# Patient Record
Sex: Male | Born: 1990 | Race: White | Hispanic: No | Marital: Single | Smoking: Former smoker
Health system: Southern US, Community
[De-identification: ages and names within clinical notes are randomized; demographics above are authoritative.]

## PROBLEM LIST (undated history)

## (undated) DIAGNOSIS — M109 Gout, unspecified: Secondary | ICD-10-CM

## (undated) DIAGNOSIS — E785 Hyperlipidemia, unspecified: Secondary | ICD-10-CM

## (undated) HISTORY — DX: Hyperlipidemia, unspecified: E78.5

## (undated) HISTORY — DX: Gout, unspecified: M10.9

---
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
## 2016-06-11 DIAGNOSIS — Z720 Tobacco use: Secondary | ICD-10-CM | POA: Insufficient documentation

## 2019-01-14 DIAGNOSIS — H6993 Unspecified Eustachian tube disorder, bilateral: Secondary | ICD-10-CM | POA: Insufficient documentation

## 2019-03-29 ENCOUNTER — Other Ambulatory Visit: Payer: Self-pay | Admitting: *Deleted

## 2019-03-29 DIAGNOSIS — Z20822 Contact with and (suspected) exposure to covid-19: Secondary | ICD-10-CM

## 2019-03-31 LAB — NOVEL CORONAVIRUS, NAA: SARS-CoV-2, NAA: NOT DETECTED

## 2019-04-20 ENCOUNTER — Other Ambulatory Visit: Payer: Self-pay

## 2019-04-20 DIAGNOSIS — Z20822 Contact with and (suspected) exposure to covid-19: Secondary | ICD-10-CM

## 2019-04-22 LAB — NOVEL CORONAVIRUS, NAA: SARS-CoV-2, NAA: NOT DETECTED

## 2019-05-11 ENCOUNTER — Ambulatory Visit: Payer: HRSA Program | Attending: Internal Medicine

## 2019-05-11 DIAGNOSIS — Z20828 Contact with and (suspected) exposure to other viral communicable diseases: Secondary | ICD-10-CM | POA: Diagnosis present

## 2019-05-11 DIAGNOSIS — Z20822 Contact with and (suspected) exposure to covid-19: Secondary | ICD-10-CM

## 2019-05-12 LAB — NOVEL CORONAVIRUS, NAA: SARS-CoV-2, NAA: NOT DETECTED

## 2019-07-29 ENCOUNTER — Ambulatory Visit: Payer: Self-pay | Attending: Internal Medicine

## 2019-07-29 DIAGNOSIS — Z23 Encounter for immunization: Secondary | ICD-10-CM

## 2019-07-29 NOTE — Progress Notes (Signed)
   Covid-19 Vaccination Clinic  Name:  Devin Jenkins    MRN: [id number] DOB: 08/03/1990  07/29/2019  Devin Jenkins was observed post Covid-19 immunization for 15 minutes without incident. He was provided with Vaccine Information Sheet and instruction to access the V-Safe system.   Devin Jenkins was instructed to call 911 with any severe reactions post vaccine: Marland Kitchen Difficulty breathing  . Swelling of face and throat  . A fast heartbeat  . A bad rash all over body  . Dizziness and weakness   Immunizations Administered    Name Date Dose VIS Date Route   Pfizer COVID-19 Vaccine 07/29/2019 11:14 AM 0.3 mL 04/23/2019 Intramuscular   Manufacturer: [employer], [employer]   Lot: [id number]   NDC: [address]-2

## 2019-08-23 ENCOUNTER — Ambulatory Visit: Payer: Self-pay | Attending: Internal Medicine

## 2019-08-23 DIAGNOSIS — Z23 Encounter for immunization: Secondary | ICD-10-CM

## 2019-08-23 NOTE — Progress Notes (Signed)
   Covid-19 Vaccination Clinic  Name:  Devin Jenkins    MRN: [id number] DOB: 05/26/1990  08/23/2019  Devin Jenkins was observed post Covid-19 immunization for 15 minutes without incident. He was provided with Vaccine Information Sheet and instruction to access the V-Safe system.   Devin Jenkins was instructed to call 911 with any severe reactions post vaccine: Marland Kitchen Difficulty breathing  . Swelling of face and throat  . A fast heartbeat  . A bad rash all over body  . Dizziness and weakness   Immunizations Administered    Name Date Dose VIS Date Route   Pfizer COVID-19 Vaccine 08/23/2019 10:14 AM 0.3 mL 04/23/2019 Intramuscular   Manufacturer: [employer], [employer]   Lot: [id number]   NDC: [address]-3

## 2022-01-28 DIAGNOSIS — L538 Other specified erythematous conditions: Secondary | ICD-10-CM | POA: Diagnosis not present

## 2022-01-28 DIAGNOSIS — L821 Other seborrheic keratosis: Secondary | ICD-10-CM | POA: Diagnosis not present

## 2022-01-28 DIAGNOSIS — L820 Inflamed seborrheic keratosis: Secondary | ICD-10-CM | POA: Diagnosis not present

## 2022-01-28 DIAGNOSIS — D2372 Other benign neoplasm of skin of left lower limb, including hip: Secondary | ICD-10-CM | POA: Diagnosis not present

## 2022-09-01 DIAGNOSIS — M79671 Pain in right foot: Secondary | ICD-10-CM | POA: Diagnosis not present

## 2022-09-01 DIAGNOSIS — J019 Acute sinusitis, unspecified: Secondary | ICD-10-CM | POA: Diagnosis not present

## 2022-11-09 DIAGNOSIS — M5489 Other dorsalgia: Secondary | ICD-10-CM | POA: Diagnosis not present

## 2022-11-09 DIAGNOSIS — R109 Unspecified abdominal pain: Secondary | ICD-10-CM | POA: Diagnosis not present

## 2022-11-21 DIAGNOSIS — M1000 Idiopathic gout, unspecified site: Secondary | ICD-10-CM | POA: Diagnosis not present

## 2023-02-08 DIAGNOSIS — R251 Tremor, unspecified: Secondary | ICD-10-CM | POA: Diagnosis not present

## 2023-02-08 DIAGNOSIS — R000 Tachycardia, unspecified: Secondary | ICD-10-CM | POA: Diagnosis not present

## 2023-02-08 DIAGNOSIS — Z13220 Encounter for screening for lipoid disorders: Secondary | ICD-10-CM | POA: Diagnosis not present

## 2023-02-08 DIAGNOSIS — M546 Pain in thoracic spine: Secondary | ICD-10-CM | POA: Diagnosis not present

## 2023-02-08 DIAGNOSIS — R079 Chest pain, unspecified: Secondary | ICD-10-CM | POA: Diagnosis not present

## 2023-02-16 DIAGNOSIS — I498 Other specified cardiac arrhythmias: Secondary | ICD-10-CM | POA: Diagnosis not present

## 2023-03-12 ENCOUNTER — Ambulatory Visit: Payer: BC Managed Care – PPO | Admitting: Internal Medicine

## 2023-03-12 ENCOUNTER — Encounter: Payer: Self-pay | Admitting: Internal Medicine

## 2023-03-12 VITALS — BP 122/80 | HR 82 | Temp 98.0°F | Ht 70.0 in | Wt 202.8 lb

## 2023-03-12 DIAGNOSIS — L659 Nonscarring hair loss, unspecified: Secondary | ICD-10-CM | POA: Diagnosis not present

## 2023-03-12 DIAGNOSIS — J3089 Other allergic rhinitis: Secondary | ICD-10-CM | POA: Diagnosis not present

## 2023-03-12 DIAGNOSIS — E781 Pure hyperglyceridemia: Secondary | ICD-10-CM | POA: Insufficient documentation

## 2023-03-12 DIAGNOSIS — E785 Hyperlipidemia, unspecified: Secondary | ICD-10-CM | POA: Diagnosis not present

## 2023-03-12 DIAGNOSIS — Z8249 Family history of ischemic heart disease and other diseases of the circulatory system: Secondary | ICD-10-CM | POA: Diagnosis not present

## 2023-03-12 DIAGNOSIS — M109 Gout, unspecified: Secondary | ICD-10-CM | POA: Insufficient documentation

## 2023-03-12 DIAGNOSIS — J324 Chronic pansinusitis: Secondary | ICD-10-CM

## 2023-03-12 LAB — LIPID PANEL
Cholesterol: 149 mg/dL (ref 0–200)
HDL: 35.4 mg/dL — ABNORMAL LOW (ref >39.00)
LDL Cholesterol: 92 mg/dL (ref 0–99)
NonHDL: 113.87
Total CHOL/HDL Ratio: 4
Triglycerides: 108 mg/dL (ref 0.0–149.0)
VLDL: 21.6 mg/dL (ref 0.0–40.0)

## 2023-03-12 MED ORDER — SIMPLY SALINE 0.9 % NA AERS: 2.0000 | INHALATION_SPRAY | NASAL | 11 refills | Status: AC

## 2023-03-12 MED ORDER — TURMERIC 5-1000 MG PO CAPS: 1000.0000 mg | ORAL_CAPSULE | Freq: Two times a day (BID) | ORAL | 3 refills | Status: AC

## 2023-03-12 MED ORDER — MAGNESIUM 500 MG PO CAPS: 500.0000 mg | ORAL_CAPSULE | Freq: Two times a day (BID) | ORAL | 3 refills | Status: AC

## 2023-03-12 MED ORDER — FINASTERIDE 1 MG PO TABS: 1.0000 mg | ORAL_TABLET | Freq: Every day | ORAL | 3 refills | Status: DC

## 2023-03-12 MED ORDER — PSEUDOEPHEDRINE HCL ER 120 MG PO TB12: 120.0000 mg | ORAL_TABLET | Freq: Two times a day (BID) | ORAL | 0 refills | Status: DC

## 2023-03-12 MED ORDER — OMEGA-3-ACID ETHYL ESTERS 1 G PO CAPS: 2.0000 g | ORAL_CAPSULE | Freq: Two times a day (BID) | ORAL | 3 refills | Status: DC

## 2023-03-12 MED ORDER — VITAMIN C 1000 MG PO TABS: 1000.0000 mg | ORAL_TABLET | Freq: Every day | ORAL | 3 refills | Status: AC

## 2023-03-12 MED ORDER — FLUTICASONE PROPIONATE 50 MCG/ACT NA SUSP: 2.0000 | Freq: Every day | NASAL | 50 refills | Status: DC

## 2023-03-12 MED ORDER — AMOXICILLIN-POT CLAVULANATE 875-125 MG PO TABS: 1.0000 | ORAL_TABLET | Freq: Two times a day (BID) | ORAL | 0 refills | Status: DC

## 2023-03-12 MED ORDER — LORATADINE 10 MG PO TABS: 10.0000 mg | ORAL_TABLET | Freq: Every day | ORAL | 11 refills | Status: AC

## 2023-03-12 MED ORDER — ROSUVASTATIN CALCIUM 40 MG PO TABS: 40.0000 mg | ORAL_TABLET | Freq: Every day | ORAL | 3 refills | Status: DC

## 2023-03-12 NOTE — Progress Notes (Signed)
[facility]: [phone]   -- Medical Office Visit --  Patient:  Devin Jenkins      Age: 32 y.o.       Sex:  male  Date:   03/12/2023 Today's Healthcare Provider: Lula Olszewski, MD  ============================================================================================= Assessment Plan    Assessment & Plan Hyperlipidemia, acquired Given his family history of early cardiovascular disease and personal history of high cholesterol, with a recent lipid panel showing total cholesterol at 236, triglycerides at 299, HDL at 37, LDL at 153, and non-HDL at 199, while on atorvastatin 10mg , we will discontinue atorvastatin and start rosuvastatin 40mg  daily. We will also add prescription-grade omega-3 fatty acids and order a lipid panel to assess the response to the new medication. He is advised to maintain a heart-healthy diet, including daily avocado.  Hair loss He is currently using over-the-counter treatments for hair loss and has inquired about prescription options. We will prescribe finasteride (Propecia) for hair loss.  Non-seasonal allergic rhinitis, unspecified trigger  Podagra He reports occasional flare-ups, about once a year, often triggered by dietary factors such as seafood. He prefers to manage the condition through diet and lifestyle modifications due to career implications of medication use. Does take tart cherry.  He is advised to continue dietary modifications to manage gout symptoms, with no medication prescribed at this time.   Family history of heart attack  Hypertriglyceridemia  Dyslipidemia  Chronic pansinusitis He reports chronic sinus congestion and yellow mucus, with no recent antibiotic treatment. We will prescribe a "sinus package" including Augmentin, Sudafed, Claritin, and a saline rinse. He is advised to use Flonase as needed, but not more than four days in a row, and to use a saline rinse nightly and Flonase as needed for allergy  symptoms or sinus congestion.   Diagnoses and all orders for this visit: Hyperlipidemia, acquired -     Turmeric 09-998 MG CAPS; Take 1,000 mg by mouth in the morning and at bedtime. -     rosuvastatin (CRESTOR) 40 MG tablet; Take 1 tablet (40 mg total) by mouth daily. Replaces atorvastatin -     omega-3 acid ethyl esters (LOVAZA) 1 g capsule; Take 2 capsules (2 g total) by mouth 2 (two) times daily. -     Lipid panel Hair loss -     finasteride (PROPECIA) 1 MG tablet; Take 1 tablet (1 mg total) by mouth daily. -     Turmeric 09-998 MG CAPS; Take 1,000 mg by mouth in the morning and at bedtime. -     Ascorbic Acid (VITAMIN C) 1000 MG tablet; Take 1 tablet (1,000 mg total) by mouth daily. Non-seasonal allergic rhinitis, unspecified trigger -     fluticasone (FLONASE) 50 MCG/ACT nasal spray; Place 2 sprays into both nostrils daily. -     Magnesium 500 MG CAPS; Take 1 capsule (500 mg total) by mouth in the morning and at bedtime. -     Turmeric 09-998 MG CAPS; Take 1,000 mg by mouth in the morning and at bedtime. Podagra -     Turmeric 09-998 MG CAPS; Take 1,000 mg by mouth in the morning and at bedtime. Family history of heart attack -     Turmeric 09-998 MG CAPS; Take 1,000 mg by mouth in the morning and at bedtime. -     rosuvastatin (CRESTOR) 40 MG tablet; Take 1 tablet (40 mg total) by mouth daily. Replaces atorvastatin -     omega-3 acid ethyl esters (LOVAZA) 1 g capsule;  Take 2 capsules (2 g total) by mouth 2 (two) times daily. -     Lipid panel Hypertriglyceridemia -     Turmeric 09-998 MG CAPS; Take 1,000 mg by mouth in the morning and at bedtime. -     rosuvastatin (CRESTOR) 40 MG tablet; Take 1 tablet (40 mg total) by mouth daily. Replaces atorvastatin -     omega-3 acid ethyl esters (LOVAZA) 1 g capsule; Take 2 capsules (2 g total) by mouth 2 (two) times daily. -     Lipid panel Dyslipidemia -     Turmeric 09-998 MG CAPS; Take 1,000 mg by mouth in the morning and at bedtime. -      rosuvastatin (CRESTOR) 40 MG tablet; Take 1 tablet (40 mg total) by mouth daily. Replaces atorvastatin -     omega-3 acid ethyl esters (LOVAZA) 1 g capsule; Take 2 capsules (2 g total) by mouth 2 (two) times daily. -     Lipid panel Chronic pansinusitis -     Saline (SIMPLY SALINE) 0.9 % AERS; Place 2 each into the nose as directed. Use nightly for sinus hygiene long-term.  Can also be used as many times daily as desired to assist with clearing congested sinuses. -     loratadine (CLARITIN) 10 MG tablet; Take 1 tablet (10 mg total) by mouth daily. -     pseudoephedrine (SUDAFED 12 HOUR) 120 MG 12 hr tablet; Take 1 tablet (120 mg total) by mouth 2 (two) times daily. -     amoxicillin-clavulanate (AUGMENTIN) 875-125 MG tablet; Take 1 tablet by mouth 2 (two) times daily.  Recommended follow-up: Annual check-ups are sufficient at this time. We will follow up after the lipid panel results are available.  Future Appointments  Date Time Provider Department Center  03/12/2024  8:40 AM Lula Olszewski, MD [facility]  Patient Care Team: Lula Olszewski, MD as PCP - General (Internal Medicine)    Subjective   32 y.o. male who has Podagra; Non-seasonal allergic rhinitis; Hair loss; Dyslipidemia; Family history of heart attack; and Hypertriglyceridemia on their problem list.. Main reasons for visit/main concerns/chief complaint: New Patient (Initial Visit) (/) and Hyperlipidemia   -------------------------------------------------------------------------------------------------- AI-Extracted: Discussed the use of AI scribe software for clinical note transcription with the patient, who gave verbal consent to proceed.  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 He has been taking atorvastatin 10mg , prescribed by a previous healthcare provider, but expresses uncertainty about the necessity of this  medication. The patient's most recent lipid panel, conducted approximately a month ago, revealed a total cholesterol of 236, triglycerides of 299, HDL of 37, LDL of 153, and non-HDL of 199. The patient's brother experienced a heart attack at the age of 67, which raises concerns about the patient's own cardiovascular risk.  The patient also reports chronic sinus congestion and yellow mucus production, which he suspects may be due to an ongoing infection. He has been using Flonase intermittently to manage these symptoms.  In addition, the patient has been taking finasteride for hair loss and is interested in obtaining a prescription for this medication. He also reports occasional flare-ups of gout, particularly after consuming certain types of fish. 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  The patient's lifestyle includes efforts to maintain a healthy diet and manage his  weight. He expresses a willingness to make dietary changes to improve his cholesterol levels and overall heart health. He also takes various supplements, including magnesium, turmeric, and vitamin C.       Note that patient  has a past medical history of Hyperlipidemia.  Problem list overviews that were updated at today's visit: Problem  Podagra  Non-Seasonal Allergic Rhinitis  Hair Loss  Dyslipidemia   Medications: not yet discussed No results found for: "HDL", "CHOLHDL" No results found for: "LDLCALC", "LDLDIRECT" No results found for: "TRIG" No results found for: "CHOL" The ASCVD Risk score (Arnett DK, et al., 2019) failed to calculate for the following reasons:   The 2019 ASCVD risk score is only valid for ages 92 to 38 No results found for: "ALT", "AST", "GGT", "ALKPHOS", "TSH", "HGBA1C" Body mass index is 29.1 kg/m.  Lipoprotein(a), Apolipoprotein B (ApoB), and High-sensitivity C-reactive  protein (hs-CRP) No results found for: "HSCRP", "LIPOA" he holds off on for now. Improving Your Cholesterol: Diet: Focus on a Mediterranean-style diet, limit saturated fats and sugars, and increase omega-3 fatty acids (fish, flaxseeds,nuts,extra virgin olive oil, avocados). Exercise: Engage in regular physical activity (aerobic exercises are particularly beneficial for HDL). Weight Management: Maintain a healthy weight. Smoking Cessation: Quitting smoking improves cholesterol levels.    Family History of Heart Attack  Hypertriglyceridemia    Med reconciliation: Current Outpatient Medications on File Prior to Visit  Medication Sig   atorvastatin (LIPITOR) 10 MG tablet Take by mouth daily.   UNABLE TO FIND Take 3,000 mg by mouth daily. Tart Cherry   No current facility-administered medications on file prior to visit.   Medications Discontinued During This Encounter  Medication Reason   fluticasone (FLONASE) 50 MCG/ACT nasal spray Reorder   Ascorbic Acid (VITAMIN C PO) Reorder   Magnesium 500 MG CAPS Reorder   Turmeric 09-998 MG CAPS Reorder   finasteride (PROPECIA) 1 MG tablet Reorder     Objective   Physical Exam  BP 122/80 (BP Location: Left Arm, Patient Position: Sitting)   Pulse 82   Temp 98 F (36.7 C) (Temporal)   Ht 5\' 10"  (1.778 m)   Wt 202 lb 12.8 oz (92 kg)   SpO2 97%   BMI 29.10 kg/m  Wt Readings from Last 10 Encounters:  03/12/23 202 lb 12.8 oz (92 kg)   Vital signs reviewed.  Nursing notes reviewed. Weight trend reviewed. Abnormalities and Problem-Specific physical exam findings:  minimal hair loss, mild truncal adiposity   General Appearance:  No acute distress appreciable.   Well-groomed, healthy-appearing male.  Well proportioned with no abnormal fat distribution.  Good muscle tone. Pulmonary:  Normal work of breathing at rest, no respiratory distress apparent. SpO2: 97 %  Musculoskeletal: All extremities are intact.  Neurological:  Awake, alert,  oriented, and engaged.  No obvious focal neurological deficits or cognitive impairments.  Sensorium seems unclouded.   Speech is clear and coherent with logical content. Psychiatric:  Appropriate mood, pleasant and cooperative demeanor, thoughtful and engaged during the exam  Results   LABS Lipid Panel: Total Cholesterol: 236 mg/dL, Triglycerides: 086 mg/dL, HDL: 37 mg/dL, LDL: 578 mg/dL, Non-HDL: 469 mg/dL (62/95/2841)  DIAGNOSTIC EKG: Normal (02/08/2023)        No results found for any visits on 03/12/23.  No visits with results within 1 Year(s) from this visit.  Latest known visit with results is:  Lab on 05/11/2019  Component Date Value   SARS-CoV-2, NAA 05/11/2019 Not Detected    No image results found.  No results found.  No results found.     Additional Info: This encounter employed real-time, collaborative documentation. The patient actively reviewed and updated their medical record on a shared screen, ensuring transparency and facilitating joint problem-solving for the problem list, overview, and plan. This approach promotes accurate, informed care. The treatment plan was discussed and reviewed in detail, including medication safety, potential side effects, and all patient questions. We confirmed understanding and comfort with the plan. Follow-up instructions were established, including contacting the office for any concerns, returning if symptoms worsen, persist, or new symptoms develop, and precautions for potential emergency department visits.

## 2023-03-12 NOTE — Assessment & Plan Note (Signed)
He reports occasional flare-ups, about once a year, often triggered by dietary factors such as seafood. He prefers to manage the condition through diet and lifestyle modifications due to career implications of medication use. Does take tart cherry.  He is advised to continue dietary modifications to manage gout symptoms, with no medication prescribed at this time.

## 2023-03-12 NOTE — Progress Notes (Signed)
Pretty good. HDL still low and family history puts you at risk but you've seen good improvement on the low dose medication(s) you were on.  It would not be unreasonable to just take half tablet daily of what I sent in.   The diet is really important too even if medication(s) fix the cholesterol.

## 2023-03-12 NOTE — Patient Instructions (Addendum)
Here is a comprehensive diet plan in patient handout format for a 32 year old male with a family history of early MI and severe dyslipidemia:  ## Comprehensive Diet Plan for Heart Health  ### Overview Managing your cholesterol and heart disease risk factors through diet is crucial. This plan focuses on foods that can help lower cholesterol, reduce inflammation, and improve overall heart health.  ### Key Dietary Components - **Omega-3 Rich Fish:** Salmon, mackerel, sardines, and herring are excellent sources of heart-healthy omega-3 fatty acids. These fish do not appear to trigger gout flares. - **Nuts & Seeds:** Walnuts, almonds, pecans, chia seeds, and flaxseeds are rich in healthy fats, fiber, and antioxidants. - **Extra Virgin Olive Oil (EVOO):** Use EVOO as your primary cooking oil. It's high in monounsaturated fats and antioxidants. - **Vegetables:** Aim for 5-9 servings of a variety of vegetables daily. Focus on leafy greens, cruciferous veggies, and colorful produce. - **Whole Grains:** Choose whole wheat, brown rice, quinoa, oats, and other minimally processed grains. - **Legumes:** Beans, lentils, and peas are excellent sources of fiber, protein, and complex carbs.  ### Sample Meal Plan **Breakfast:** - Oatmeal with berries, walnuts, and cinnamon - Avocado toast on whole grain bread  **Lunch:** - Salmon salad on a bed of mixed greens - Lentil and vegetable soup with a side of whole grain crackers  **Dinner:** - Grilled mackerel with roasted Brussels sprouts and sweet potatoes - Quinoa stir-fry with tofu, broccoli, and bell peppers  **Snacks:**  - Handful of raw almonds or pecans - Hummus with carrot and cucumber sticks  ### Tips for Success - Drink plenty of water throughout the day. - Limit processed foods, added sugars, and unhealthy fats. - Experiment with new recipes and flavor combinations to keep meals interesting. - Stay physically active for at least 30 minutes  most days. - Work closely with your healthcare team to monitor your progress.  Let me know if you have any other questions!  Chia seeds (5.8g omega-3 per 1 oz) Flaxseeds (6.3g omega-3 per 1 tbsp) Walnuts (2.5g omega-3 per 1 oz) Canola oil (1.3g omega-3 per 1 tbsp) Soybeans (0.9g omega-3 per 1/2 cup)  Chia seeds are an excellent source of plant-based omega-3s. They can be easily incorporated into smoothies, oatmeal, baked goods, and more. Let me know if you have any other questions!

## 2023-03-12 NOTE — Assessment & Plan Note (Signed)
Given his family history of early cardiovascular disease and personal history of high cholesterol, with a recent lipid panel showing total cholesterol at 236, triglycerides at 299, HDL at 37, LDL at 153, and non-HDL at 199, while on atorvastatin 10mg , we will discontinue atorvastatin and start rosuvastatin 40mg  daily. We will also add prescription-grade omega-3 fatty acids and order a lipid panel to assess the response to the new medication. He is advised to maintain a heart-healthy diet, including daily avocado.

## 2023-03-12 NOTE — Assessment & Plan Note (Signed)
He is currently using over-the-counter treatments for hair loss and has inquired about prescription options. We will prescribe finasteride (Propecia) for hair loss.

## 2023-04-23 DIAGNOSIS — D485 Neoplasm of uncertain behavior of skin: Secondary | ICD-10-CM | POA: Diagnosis not present

## 2023-04-23 DIAGNOSIS — D2372 Other benign neoplasm of skin of left lower limb, including hip: Secondary | ICD-10-CM | POA: Diagnosis not present

## 2023-04-23 DIAGNOSIS — L821 Other seborrheic keratosis: Secondary | ICD-10-CM | POA: Diagnosis not present

## 2023-07-09 DIAGNOSIS — J019 Acute sinusitis, unspecified: Secondary | ICD-10-CM | POA: Diagnosis not present

## 2023-07-10 ENCOUNTER — Telehealth: Payer: BC Managed Care – PPO | Admitting: Physician Assistant

## 2023-07-10 ENCOUNTER — Encounter (INDEPENDENT_AMBULATORY_CARE_PROVIDER_SITE_OTHER): Payer: Self-pay | Admitting: Internal Medicine

## 2023-07-10 DIAGNOSIS — J069 Acute upper respiratory infection, unspecified: Secondary | ICD-10-CM | POA: Diagnosis not present

## 2023-07-10 MED ORDER — FLUTICASONE PROPIONATE 50 MCG/ACT NA SUSP: 2.0000 | Freq: Every day | NASAL | 0 refills | Status: AC

## 2023-07-10 NOTE — Telephone Encounter (Signed)
 MyChart secure digital messaging clinical encounter  Chief Complaint:  "Cold symptoms including sore throat, congestion, and yellow mucus since Tuesday morning. No fever. Requested medication after e-visit recommendations of Flonase and Aleve-D were reportedly ineffective."  Relevant History: - Patient had an e-visit earlier today (07/10/2023) for URI symptoms - Known history of non-seasonal allergic rhinitis, currently using Flonase regularly - Previously tried Aleve-D as recommended in e-visit without satisfactory relief - No fever reported - Symptoms present for approximately 2 days - Patient specifically requesting stronger medication (steroids) based on girlfriend's experience with similar symptoms - PMH significant for dyslipidemia, podagra, hair loss, hypertriglyceridemia, family history of premature heart attack  Assessment: Acute upper respiratory infection (J06.9), likely viral in etiology, based on: - Short duration of symptoms (2 days) - Presence of yellow mucus, sore throat, and congestion without fever - Temporal relationship to reported similar illness in close contact - Underlying non-seasonal allergic rhinitis (J30.9) may be contributing to symptom burden  Differential considerations include: - Acute rhinosinusitis - Allergic rhinitis exacerbation - Acute pharyngitis  Current guidelines (American Academy of Family Physicians, 2023) recommend conservative symptomatic management for uncomplicated URI in the first 7-10 days, with consideration of additional interventions if symptoms persist or worsen after this period.  Plan: 1. Recommended continued use of regular Flonase for underlying allergic rhinitis 2. Advised saline nasal irrigation 2-3 times daily for symptom relief 3. Suggested addition of oral antihistamine if not already taking 4. Provided supportive care recommendations including salt water gargles, adequate hydration, and rest 5. No steroids or antibiotics  indicated at this time per current guidelines (https://www.LimitLaws.hu) 6. Patient educated on expected course of viral URI and appropriate follow-up timeframes 7. Provided specific parameters for when to reach out for reassessment (persistent symptoms >7-10 days, worsening after 5 days, development of fever >101F) 8. Detailed emergency warning signs requiring immediate care  Patient Education: - Explained typical course of viral upper respiratory infections - Discussed rationale for conservative management in early course of illness - Educated on differences between individual cases and why treatment approaches may vary - Provided home care strategies and symptom management techniques - Reviewed warning signs requiring further evaluation  Follow-up: - Patient advised to follow up via MyChart or schedule an appointment if symptoms persist beyond 7-10 days or worsen after 5 days - No routine follow-up specifically scheduled at this time  Medical Decision Making: Low complexity medical decision making due to: - Self-limited mild illness with established treatment protocol - Review and consideration of recent e-visit findings - Consideration of patient's request for specific medication (steroids) - Medical necessity to evaluate appropriateness of requested treatment - Analysis of current clinical guidelines regarding URI management - Risks vs. benefits assessment for corticosteroid use in early viral illness  Current Medical Guidelines Reference: According to the American Academy of Family Physicians (AAFP) and the Centers for Disease Control and Prevention (CDC), symptomatic treatment is recommended for uncomplicated viral URIs in the first 7-10 days of illness. Corticosteroids are not routinely recommended for uncomplicated viral URI early in the course, as benefits are limited and potential risks include immune suppression.  Please see the MyChart message  reply(ies) for my assessment and plan.   This patient gave consent for this Medical Advice Message and is aware that it may result in a bill to [employer], as well as the possibility of receiving a bill for a co-payment or deductible. They are an established patient, but are not seeking medical advice exclusively about a problem treated during  an in person or video visit in the last seven days. I did not recommend an in person or video visit within seven days of my reply.    I spent a total of 15 minutes cumulative time within 7 days through [employer]. Lula Olszewski, MD  ------------------------- Problem List Updates: Add: Acute upper respiratory infection (J06.9) - onset 07/08/2023

## 2023-07-10 NOTE — Progress Notes (Signed)

## 2023-07-10 NOTE — Progress Notes (Signed)
 I have spent 5 minutes in review of e-visit questionnaire, review and updating patient chart, medical decision making and response to patient.   Piedad Climes, PA-C

## 2023-07-29 ENCOUNTER — Ambulatory Visit (INDEPENDENT_AMBULATORY_CARE_PROVIDER_SITE_OTHER): Admitting: Sports Medicine

## 2023-07-29 ENCOUNTER — Encounter: Payer: Self-pay | Admitting: Sports Medicine

## 2023-07-29 VITALS — BP 124/82 | HR 80 | Temp 98.4°F | Resp 20 | Ht 70.0 in | Wt 207.1 lb

## 2023-07-29 DIAGNOSIS — Z0289 Encounter for other administrative examinations: Secondary | ICD-10-CM

## 2023-07-29 HISTORY — DX: Encounter for other administrative examinations: Z02.89

## 2023-07-29 NOTE — Progress Notes (Signed)
    Procedures performed today:    None.  Independent interpretation of notes and tests performed by another provider:   None.  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 No EKG needed until 33 years old.    ____________________________________________ Ihor Austin. Benjamin Stain, M.D., ABFM., CAQSM., AME. Primary Care and Sports Medicine [facility]  Adjunct Professor of Family Medicine  [employer]  Restaurant manager, fast food

## 2023-07-29 NOTE — Assessment & Plan Note (Signed)
 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 No EKG needed until 33 years old.

## 2023-12-27 DIAGNOSIS — M109 Gout, unspecified: Secondary | ICD-10-CM | POA: Diagnosis not present

## 2024-01-13 ENCOUNTER — Encounter: Payer: Self-pay | Admitting: Sports Medicine

## 2024-01-15 ENCOUNTER — Telehealth: Payer: Self-pay

## 2024-01-15 NOTE — Telephone Encounter (Signed)
 Copied from CRM #[id number]. Topic: Referral - Request for Referral >> Jan 15, 2024  2:04 PM [place] A wrote: Did the patient discuss referral with their provider in the last year? Yes (If No - schedule appointment) (If Yes - send message)  Appointment offered? Yes  Type of order/referral and detailed reason for visit: Rheumatology  Preference of office, provider, location: [facility] on [facility]  If referral order, have you been seen by this specialty before? No (If Yes, this issue or another issue? When? Where?  Can we respond through MyChart? Yes  Spoke with pt advise pt per provider pt needs appt with labs before we can even send a referral to [facility] due they require to do that for referrals. Pt states he will make appt.

## 2024-01-22 ENCOUNTER — Ambulatory Visit: Admitting: Internal Medicine

## 2024-02-02 ENCOUNTER — Encounter: Payer: Self-pay | Admitting: Internal Medicine

## 2024-02-02 ENCOUNTER — Ambulatory Visit: Admitting: Internal Medicine

## 2024-02-02 VITALS — BP 130/78 | HR 65 | Temp 97.7°F | Wt 212.0 lb

## 2024-02-02 DIAGNOSIS — M109 Gout, unspecified: Secondary | ICD-10-CM | POA: Diagnosis not present

## 2024-02-02 MED ORDER — ALLOPURINOL 100 MG PO TABS: 100.0000 mg | ORAL_TABLET | Freq: Every day | ORAL | 6 refills | Status: DC

## 2024-02-02 MED ORDER — COLCHICINE 0.6 MG PO CAPS: ORAL_CAPSULE | ORAL | 0 refills | Status: AC

## 2024-02-02 MED ORDER — PREDNISONE 20 MG PO TABS: ORAL_TABLET | ORAL | 0 refills | Status: DC

## 2024-02-02 NOTE — Assessment & Plan Note (Signed)
 Gout, right ankle and foot   Chronic gout with worsening flare-ups affects his right ankle and foot, lasting 8-9 days. Classic symptoms are present without crystal analysis, occurring approximately twice a year. Dietary changes include avoiding red meat, shellfish, and adjusting alcohol intake. 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 Start allopurinol  100 mg daily, titrate based on uric acid levels. Prescribe colchicine  for acute flare management, taken twice daily during flares. Prescribe prednisone  for severe flares, with caution regarding potential side effects like weight gain and Cushing's syndrome. Order a baseline uric acid blood test. Refer to podiatry for joint aspiration during a flare to confirm diagnosis. Discuss lifestyle modifications, including avoiding trigger foods and alcohol. Advise on the importance of hydration and avoiding dehydration. Discuss potential need for rheumatology referral for advanced treatment if necessary.  He has adjusted alcohol intake to manage gout, primarily consuming vodka and avoiding beer. Acknowledges difficulty in completely abstaining from beer but is making efforts to reduce consumption. Continue to limit alcohol intake, particularly beer, to manage gout symptoms. Discuss with AME regarding medication use and potential exceptions for gout management.

## 2024-02-02 NOTE — Progress Notes (Signed)
 ==============================  [facility]: [id number]   -- Medical Office Visit --  Patient: Devin Jenkins      Age: 33 y.o.       Sex:  male  Date:   02/02/2024 Today's Healthcare Provider: Bernardino KANDICE Cone, MD  ==============================   Chief Complaint: Foot Swelling (Pt feels like he may be experiencing gout symptoms and would like to discuss next steps. Pt declined flu vaccine today.)  Discussed the use of AI scribe software for clinical note transcription with the patient, who gave verbal consent to proceed.  History of Present Illness 33 year old male who presents with worsening gout flare-ups.  He experiences worsening gout flare-ups, with recent episodes lasting 8 to 9 days compared to previous episodes of 3 to 4 days. The flare-ups cause significant swelling and pain in his big toe, impairing his ability to walk. He has not had any crystals analyzed from his joints nor has he undergone blood work to confirm the diagnosis of gout.  He has made dietary changes, avoiding red meat and shellfish, although he notes a recent flare-up occurred after consuming lobster. He has gained approximately 10 pounds over the past year, attributing some of this to increased muscle mass from lifting weights. He maintains a waist size of 33 inches.  He avoids beer and primarily consumes vodka to minimize gout triggers. He stays hydrated, especially when using the sauna, as he notices dehydration can trigger flare-ups. He drinks alkaline water and consumes cherries, believing they may help manage his symptoms.  He has not been formally diagnosed with gout but suspects it due to the location and nature of his symptoms. He has not used colchicine  or allopurinol  previously. He has taken prednisone  in the past for acute management of symptoms.  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 Certain medications require  approval from his aviation medical examiner and may necessitate grounding during use.  Wt Readings from Last 50 Encounters:  02/02/24 212 lb (96.2 kg)  07/29/23 207 lb 1.9 oz (93.9 kg)  03/12/23 202 lb 12.8 oz (92 kg)   BMI Readings from Last 50 Encounters:  02/02/24 30.42 kg/m  07/29/23 29.72 kg/m  03/12/23 29.10 kg/m   Background Reviewed: Problem List: has Podagra; Non-seasonal allergic rhinitis; Hair loss; Dyslipidemia; Family history of heart attack; Hypertriglyceridemia; and Encounter for [employer]) examination on their problem list. Past Medical History:  has a past medical history of Hyperlipidemia. Past Surgical History:   has no past surgical history on file. Social History:   reports that he has quit smoking. His smoking use included cigarettes and cigars. He has never used smokeless tobacco. He reports current alcohol use of about 6.0 - 9.0 standard drinks of alcohol per week. He reports that he does not use drugs. Family History:  family history includes Alcohol abuse in his father and mother; Arthritis in his paternal grandmother; Cancer in his paternal grandfather; Heart attack in his brother; Hyperlipidemia in his brother and mother; Hypertension in his brother and father; Stroke in his mother. Allergies:  has no known allergies.   Medication Reconciliation: Current Outpatient Medications on File Prior to Visit  Medication Sig   Ascorbic Acid (VITAMIN C ) 1000 MG tablet Take 1 tablet (1,000 mg total) by mouth daily.   atorvastatin (LIPITOR) 10 MG tablet Take by mouth daily.   finasteride  (PROPECIA ) 1 MG tablet Take 1 tablet (1  mg total) by mouth daily.   fluticasone  (FLONASE ) 50 MCG/ACT nasal spray Place 2 sprays into both nostrils daily.   loratadine  (CLARITIN ) 10 MG tablet Take 1 tablet (10 mg total) by mouth daily.   Magnesium  500 MG CAPS Take 1 capsule (500 mg total) by mouth in the morning and at bedtime.   omega-3 acid ethyl esters  (LOVAZA ) 1 g capsule Take 2 capsules (2 g total) by mouth 2 (two) times daily.   rosuvastatin  (CRESTOR ) 40 MG tablet Take 1 tablet (40 mg total) by mouth daily. Replaces atorvastatin   Saline (SIMPLY SALINE) 0.9 % AERS Place 2 each into the nose as directed. Use nightly for sinus hygiene long-term.  Can also be used as many times daily as desired to assist with clearing congested sinuses.   Turmeric 09-998 MG CAPS Take 1,000 mg by mouth in the morning and at bedtime.   UNABLE TO FIND Take 3,000 mg by mouth daily. Tart Cherry   amoxicillin -clavulanate (AUGMENTIN ) 875-125 MG tablet Take 1 tablet by mouth 2 (two) times daily. (Patient not taking: Reported on 02/02/2024)   pseudoephedrine  (SUDAFED 12 HOUR) 120 MG 12 hr tablet Take 1 tablet (120 mg total) by mouth 2 (two) times daily. (Patient not taking: Reported on 02/02/2024)   No current facility-administered medications on file prior to visit.  There are no discontinued medications.   Physical Exam:    02/02/2024    2:54 PM 07/29/2023   11:13 AM 03/12/2023   10:30 AM  Vitals with BMI  Height  5' 10 5' 10  Weight 212 lbs 207 lbs 2 oz 202 lbs 13 oz  BMI  29.72 29.1  Systolic 130 124 877  Diastolic 78 82 80  Pulse 65 80 82  Vital signs reviewed.  Nursing notes reviewed. Weight trend reviewed. Physical Activity: Not on file   General Appearance:  No acute distress appreciable.   Well-groomed, healthy-appearing male.  Well proportioned with no abnormal fat distribution.  Good muscle tone. Pulmonary:  Normal work of breathing at rest, no respiratory distress apparent. SpO2: 98 %  Musculoskeletal: All extremities are intact.  Neurological:  Awake, alert, oriented, and engaged.  No obvious focal neurological deficits or cognitive impairments.  Sensorium seems unclouded.   Speech is clear and coherent with logical content. Psychiatric:  Appropriate mood, pleasant and cooperative demeanor, thoughtful and engaged during the exam     03/12/2023    10:35 AM  PHQ 2/9 Scores  PHQ - 2 Score 0   Office Visit on 03/12/2023  Component Date Value Ref Range Status   Cholesterol 03/12/2023 149  0 - 200 mg/dL Final   Triglycerides 89/69/7975 108.0  0.0 - 149.0 mg/dL Final   HDL 89/69/7975 35.40 (L)  >60.99 mg/dL Final   VLDL 89/69/7975 21.6  0.0 - 40.0 mg/dL Final   LDL Cholesterol 03/12/2023 92  0 - 99 mg/dL Final   Total CHOL/HDL Ratio 03/12/2023 4   Final   NonHDL 03/12/2023 113.87   Final       ASSESSMENT & PLAN   Assessment & Plan Podagra Gout, right ankle and foot   Chronic gout with worsening flare-ups affects his right ankle and foot, lasting 8-9 days. Classic symptoms are present without crystal analysis, occurring approximately twice a year. Dietary changes include avoiding red meat, shellfish, and adjusting alcohol intake. 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 Start allopurinol  100 mg daily, titrate based on uric acid levels. Prescribe colchicine  for acute flare  management, taken twice daily during flares. Prescribe prednisone  for severe flares, with caution regarding potential side effects like weight gain and Cushing's syndrome. Order a baseline uric acid blood test. Refer to podiatry for joint aspiration during a flare to confirm diagnosis. Discuss lifestyle modifications, including avoiding trigger foods and alcohol. Advise on the importance of hydration and avoiding dehydration. Discuss potential need for rheumatology referral for advanced treatment if necessary.  He has adjusted alcohol intake to manage gout, primarily consuming vodka and avoiding beer. Acknowledges difficulty in completely abstaining from beer but is making efforts to reduce consumption. Continue to limit alcohol intake, particularly beer, to manage gout symptoms. Discuss with AME regarding medication use and potential exceptions for gout management.  ORDER ASSOCIATIONS  #   DIAGNOSIS / CONDITION ICD-10 ENCOUNTER ORDER      ICD-10-CM   1. Podagra  M10.9 predniSONE  (DELTASONE ) 20 MG tablet    Colchicine  0.6 MG CAPS    allopurinol  (ZYLOPRIM ) 100 MG tablet    Ambulatory referral to Podiatry    Ambulatory referral to Rheumatology    Uric acid           Orders Placed in Encounter:    Meds ordered this encounter  Medications   predniSONE  (DELTASONE ) 20 MG tablet    Sig: Take 2 pills for 3 days, 1 pill for 4 days    Dispense:  10 tablet    Refill:  0   Colchicine  0.6 MG CAPS    Sig: Day 1: Take 2 caps, then 1 cap an hour later. Day 2 and beyond: 1 cap daily    Dispense:  30 capsule    Refill:  0   allopurinol  (ZYLOPRIM ) 100 MG tablet    Sig: Take 1 tablet (100 mg total) by mouth daily.    Dispense:  30 tablet    Refill:  6    Ambulatory referral to Podiatry         Ambulatory referral to Rheumatology       Comments: Rheumatology referral comments:  - Diagnostic confirmation is requested. If feasible, aspiration of the affected MTP joint for synovial fluid analysis and polarized microscopy is recommended, as crystal identification remains the gold standard for gout diagnosis, particularly in cases of diagnostic uncertainty or atypical presentation.[1][2][3][4][5]  - The patient meets criteria for urate-lowering therapy (ULT) initiation due to recurrent flares and persistent symptoms. Allopurinol  is preferred as first-line ULT, with a low starting dose and gradual titration to achieve serum urate <6.0 mg/dL, per [employer] of Rheumatology guidelines.[6][7][1]  - Anti-inflammatory prophylaxis (colchicine  0.5-0.6 mg daily) should be considered during ULT initiation and dose adjustment to reduce risk of flare.[1][6][7]  - The patient expresses interest in further management options, including confirmation of diagnosis, optimal ULT initiation, and long-term disease control. Education regarding lifestyle modification and dietary interventions is requested, though these have modest effects on urate  levels.[1][7][8]  - Persistent forefoot pain may reflect ongoing inflammation, early chronic gout, or other pathology; evaluation for tophus, joint damage, or alternative diagnoses is warranted.[1][2][4][5]    Uric acid              This document was synthesized by artificial intelligence ([employer]) using HIPAA-compliant recording of the clinical interaction;   We discussed the use of AI scribe software for clinical note transcription with the patient, who gave verbal consent to proceed. additional Info: This encounter employed state-of-the-art, real-time, collaborative documentation. The patient actively reviewed and assisted in updating their electronic medical record on a shared  screen, ensuring transparency and facilitating joint problem-solving for the problem list, overview, and plan. This approach promotes accurate, informed care. The treatment plan was discussed and reviewed in detail, including medication safety, potential side effects, and all patient questions. We confirmed understanding and comfort with the plan. Follow-up instructions were established, including contacting the office for any concerns, returning if symptoms worsen, persist, or new symptoms develop, and precautions for potential emergency department visits.

## 2024-02-02 NOTE — Patient Instructions (Addendum)
 It was a pleasure seeing you today! Your health and satisfaction are our top priorities.  Devin Cone, MD  VISIT SUMMARY: During your visit, we discussed your worsening gout flare-ups, which have been causing significant pain and swelling in your big toe, impairing your ability to walk. 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  YOUR PLAN: -GOUT: Gout is a type of arthritis caused by the buildup of uric acid crystals in the joints, leading to pain and swelling. We will start you on allopurinol  100 mg daily to manage uric acid levels, and you should take colchicine  twice daily during flare-ups. For severe flares, you can use prednisone , but be cautious of potential side effects like weight gain. We will also do a baseline uric acid blood test and refer you to podiatry for joint aspiration during a flare to confirm the diagnosis. Continue to avoid trigger foods and alcohol, stay hydrated, and consider a referral to rheumatology if needed.  -ALCOHOL USE: Alcohol can trigger gout flare-ups, especially beer. You have been managing your intake by primarily consuming vodka and avoiding beer. Continue to limit alcohol intake, particularly beer, to help manage your gout symptoms. Discuss with your aviation medical examiner regarding medication use and potential exceptions for gout management.  INSTRUCTIONS: Please follow up with a baseline uric acid blood test and schedule an appointment with podiatry for joint aspiration during a flare. Discuss your medication use and any potential exceptions with your aviation medical examiner. If your symptoms do not improve or worsen, consider a referral to rheumatology for advanced treatment.  Your Providers PCP: Jenkins Devin MATSU, MD,  [phone]) Referring Provider: Cone Devin MATSU, MD,  [phone]) Care Team Provider: Curtis Debby PARAS,  MD  NEXT STEPS: [x]  Early Intervention: Schedule sooner appointment, call our on-call services, or go to emergency room if there is any significant Increase in pain or discomfort New or worsening symptoms Sudden or severe changes in your health [x]  Flexible Follow-Up: We recommend a No follow-ups on file. for optimal routine care. This allows for progress monitoring and treatment adjustments. [x]  Preventive Care: Schedule your annual preventive care visit! It's typically covered by insurance and helps identify potential health issues early. [x]  Lab & X-ray Appointments: Incomplete tests scheduled today, or call to schedule. X-rays: [facility] (M-F, 8:30am-noon or 1pm-5pm). [x]  Medical Information Release: Sign a release form at front desk to obtain relevant medical information we don't have.  MAKING THE MOST OF OUR FOCUSED 20 MINUTE APPOINTMENTS: [x]   Clearly state your top concerns at the beginning of the visit to focus our discussion [x]   If you anticipate you will need more time, please inform the front desk during scheduling - we can book multiple appointments in the same week. [x]   If you have transportation problems- use our convenient video appointments or ask about transportation support. [x]   We can get down to business faster if you use MyChart to update information before the visit and submit non-urgent questions before your visit. Thank you for taking the time to provide details through MyChart.  Let our nurse know and she can import this information into your encounter documents.  Arrival and Wait Times: [x]   Arriving on time ensures that everyone receives prompt attention. [x]   Early morning (8a) and afternoon (1p) appointments tend to have shortest wait times. [x]   Unfortunately, we cannot delay appointments for late arrivals or  hold slots during phone calls.  Getting Answers and Following Up [x]   Simple Questions & Concerns: For quick questions or basic  follow-up after your visit, reach us  at [phone] or MyChart messaging. [x]   Complex Concerns: If your concern is more complex, scheduling an appointment might be best. Discuss this with the staff to find the most suitable option. [x]   Lab & Imaging Results: We'll contact you directly if results are abnormal or you don't use MyChart. Most normal results will be on MyChart within 2-3 business days, with a review message from Dr. Jesus. Haven't heard back in 2 weeks? Need results sooner? Contact us  at [phone]. [x]   Referrals: Our referral coordinator will manage specialist referrals. The specialist's office should contact you within 2 weeks to schedule an appointment. Call us  if you haven't heard from them after 2 weeks.  Staying Connected [x]   MyChart: Activate your MyChart for the fastest way to access results and message us . See the last page of this paperwork for instructions on how to activate.  Bring to Your Next Appointment [x]   Medications: Please bring all your medication bottles to your next appointment to ensure we have an accurate record of your prescriptions. [x]   Health Diaries: If you're monitoring any health conditions at home, keeping a diary of your readings can be very helpful for discussions at your next appointment.  Billing [x]   X-ray & Lab Orders: These are billed by separate companies. Contact the invoicing company directly for questions or concerns. [x]   Visit Charges: Discuss any billing inquiries with our administrative services team.  Your Satisfaction Matters [x]   Share Your Experience: We strive for your satisfaction! If you have any complaints, or preferably compliments, please let Dr. Jesus know directly or contact our Practice Administrators, Manuelita Rubin or [employer], by asking at the front desk.   Reviewing Your Records [x]   Review this early draft of your clinical encounter notes below and the final encounter summary tomorrow on MyChart  after its been completed.  All orders placed so far are visible here: Podagra -     predniSONE ; Take 2 pills for 3 days, 1 pill for 4 days  Dispense: 10 tablet; Refill: 0 -     Colchicine ; Day 1: Take 2 caps, then 1 cap an hour later. Day 2 and beyond: 1 cap daily  Dispense: 30 capsule; Refill: 0 -     Allopurinol ; Take 1 tablet (100 mg total) by mouth daily.  Dispense: 30 tablet; Refill: 6 -     Ambulatory referral to Podiatry -     Ambulatory referral to Rheumatology -     Uric acid          Gout After-Visit Summary  Diagnosis and Plan:  - Clinical presentation is consistent with gout, with recurrent flares involving the right first metatarsophalangeal joint and persistent forefoot pain. Crystal confirmation is pending.  - Indication for urate-lowering therapy (ULT) is established due to recurrent flares.[1][2]  Medication Instructions:  - Allopurinol : Start 100 mg orally once daily, beginning this weekend. This is the recommended initial dose for ULT, per [employer] guidelines, to minimize risk of hypersensitivity and flare.[2]  - Dose will be titrated every 3-6 weeks based on serum urate, aiming for <6.0 mg/dL.[1][2]  - Continue indefinitely unless contraindications or adverse reactions develop.  - Colchicine : Start 0.6 mg orally once daily, beginning concurrently with allopurinol . This serves as anti-inflammatory prophylaxis to reduce risk of gout flares during ULT initiation and  titration.[1][2][3]  - Continue for at least 3-6 months after starting allopurinol , or until serum urate target is reached and flare-free for at least 1 month.[2][1]  - If a flare occurs, increase colchicine  to 1.2 mg at onset, then 0.6 mg one hour later, followed by 0.6 mg once or twice daily for up to 7-10 days, as tolerated.[1][2][4]  Prednisone  Guidance:  - Prednisone  is reserved for management of acute flares if colchicine  is ineffective, not tolerated, or  contraindicated.[1][2][4]  - If a flare develops and colchicine  is not sufficient, initiate prednisone  at 0.5 mg/kg/day (typically 30-40 mg daily), then taper over 7-10 days.[1][2][4]  - Avoid prednisone  in poorly controlled diabetes, active infection, or other contraindications.[1]  - Do not use prednisone  for flare prophylaxis unless specifically directed.  Lifestyle and Education:  - Dietary modifications (limiting red meat, shellfish, alcohol) have modest impact on urate levels and flare risk.[1][4]  - Maintain hydration and avoid known dietary triggers.  - Continue regular exercise as tolerated.  Monitoring and Follow-Up:  - Schedule follow-up in 3-6 weeks for assessment of serum urate, renal and liver function, and review of medication tolerance.[1][2][5]  - Return earlier if rash, severe gastrointestinal symptoms, or signs of hypersensitivity (fever, mucosal involvement) develop.[6]  - If a flare occurs, contact clinic for further management and possible adjustment of anti-inflammatory therapy.  - Long-term monitoring will include periodic laboratory assessment and dose titration of allopurinol  to maintain target urate.[1][2][5]  Referral:  - Rheumatology and podiatry referrals are in process for diagnostic confirmation and management of persistent forefoot pain.  When to Return:  - Return for next visit in 3-6 weeks, or sooner if experiencing adverse effects, new or worsening symptoms, or difficulty managing a flare.  Patient Engagement:  - Education on gout pathophysiology, medication adherence, and flare management provided. Reinforced importance of ongoing communication regarding symptoms and medication side effects.[1][2] References Gout. Mikuls TR. The [place] Journal of Medicine. 2022;387(20):1877-1887. doi:10.1056/NEJMcp2203385. 2020 American College of Rheumatology Guideline for the Management of Gout. FitzGerald JD, Dalbeth N, Mikuls T, et al. Arthritis  Care & Research. 2020;72(6):744-760. doi:10.1002/acr.75819. Management of Gout: Update from the [employer]. American Academy of Family Physicians [phone]). Management of Acute and Recurrent Gout: A Clinical Practice Guideline From the [employer]. Lamond DELENA Arloa SIBYL, [place] MA, et al. Annals of Internal Medicine. 2017;166(1):58-68. doi:10.7326/M16-0570. Management of Gout: A 74-Year-Old Man With a History of Podagra, Hyperuricemia, and Mild Renal Insufficiency. Shmerling RH. JAMA. 2012;308(20):2133-41. doi:10.1001/jama.7987.34971. ZYLOPRIM . Food and Drug Administration. Updated date: 2008-04-11.           ALLERGY MANAGEMENT PLAN  This plan is designed to help manage your allergic rhinitis (nasal allergies) effectively. Follow these steps daily for best results.  Sinus saline sprays- use nightly, and after sneezing episodes or exposure to allergen.  Insert deeply and spray mist into nose while leaning over sink at 45 degrees,  while gently breathing. Also blow out onto tissue while leaning forward 45 degrees. Once daily, after a sinus rinse, use sensimist.  Just before bedtime is best. This only needed if allergies acting up.  If this is inadequate add-on once daily for levocetirizine / xyzal 5 mg for nondrowsy antihistamine Take benadryl 25 mg at bedtime also if allergic mucus is persisting  When allergies cause chronic swelling in sinuses, it leads to sinus infections:    DAILY TREATMENT ROUTINE   Time of Day Treatment Steps  Morning 1. Saline Nasal Spray - Use to cleanse nasal passages 2.  Xyzal (levocetirizine) - Take one tablet daily   Throughout Day Saline Nasal Spray - Use 2 additional times (mid-day and afternoon)   Evening/Bedtime 1. Saline Nasal Rinse - Thoroughly clean nasal passages 2. Flonase  Sensimist - Apply after nasal rinse 3. Benadryl (diphenhydramine) - Take 25mg  if experiencing persistent congestion    PROPER TECHNIQUE  GUIDE       Saline Nasal Spray/Rinse Technique: Lean forward over sink at a 45-degree angle Turn head slightly to one side Insert spray tip into upper nostril Spray gently while breathing lightly through your nose Repeat on other side Gently blow nose to clear excess solution Use saline spray 3 times daily to keep nasal passages moist and clear allergens.       Flonase  Sensimist Technique: Shake bottle gently before each use Prime the bottle if it's new or hasn't been used for a week Tilt your head forward slightly Insert tip into nostril, pointing away from the center of your nose Spray while inhaling gently Repeat in other nostril Use Flonase  Sensimist once daily, preferably at bedtime after using saline rinse. It may take several days of regular use to feel maximum benefit.   WHY FLONASE  SENSIMIST?   Benefits of Flonase  Sensimist:  Alcohol-free and scent-free formula - gentler on sensitive nasal passages Fine mist application - more comfortable with less dripping down throat Effectively relieves nasal congestion, sneezing, runny nose, and even eye symptoms 24-hour relief with once-daily dosing Uses a more potent form of fluticasone  that works at a lower dose Less liquid per spray means less discomfort  UNDERSTANDING YOUR MEDICATIONS   Medication How It Works Important Notes  Flonase  Sensimist (fluticasone  furoate) Reduces inflammation in nasal passages, addressing the underlying cause of allergy symptoms - Takes several days for full effect - Use daily for best results - Safe for long-term use   Xyzal (levocetirizine) Blocks histamine to reduce allergy symptoms like sneezing and itching - Take at the same time each day - May cause drowsiness in some people - Once-daily dosing   Benadryl (diphenhydramine) Antihistamine that provides additional relief for breakthrough symptoms - Causes drowsiness - Use only at bedtime - For occasional use when needed   Saline Spray/Rinse  Physically removes allergens and moistens nasal passages - Safe to use frequently - Improves effectiveness of other treatments - Reduces nasal irritation    CONTACT YOUR PROVIDER IF: Your symptoms do not improve after 1-2 weeks of following this plan You develop sinus pain with fever or green/yellow discharge You experience frequent nosebleeds You develop new or worsening symptoms You have questions about your treatment plan     ADDITIONAL ALLERGY MANAGEMENT TIPS   HELPFUL STRATEGIES: ?? Keep windows closed during high pollen seasons ??? Use allergen-proof covers for pillows and mattresses ?? Vacuum regularly with a HEPA filter vacuum ?? Shower and change clothes after spending time outdoors ?? Check local pollen counts and limit outdoor time when counts are high ?? Stay well-hydrated to help keep mucous membranes moist     Finished thinking NEJM Review According to an invited review in The [place] Journal of Medicine, the clinical presentation described--recurrent podagra, dietary triggers, and prolonged flares--is highly suggestive of gout, even in the absence of crystal confirmation. However, aspiration of the affected joint to identify monosodium urate crystals remains the diagnostic gold standard when feasible.[1] For management of acute flares, first-line options include colchicine , NSAIDs, and glucocorticoids, with the choice tailored to comorbidities. Colchicine  is recommended at a dose of 1.0-1.2 mg orally, followed  by 0.5-0.6 mg after one hour on day 1, then 0.5-0.6 mg once or twice daily for 7-10 days. For flare prophylaxis during urate-lowering therapy initiation, colchicine  0.5-0.6 mg once or twice daily is advised. Systemic glucocorticoids (e.g., prednisone  0.5 mg/kg/day, tapered over 7-10 days) are an alternative, but should be avoided in poorly controlled diabetes or active infection. NSAIDs are effective but contraindicated in patients with peptic ulcer disease, CKD, or  cardiovascular disease.[1] Allopurinol  is the preferred urate-lowering therapy, initiated at <=100 mg daily and titrated every 3-6 weeks to achieve serum urate <6.0 mg/dL. Prophylactic anti-inflammatory therapy (colchicine  or low-dose NSAID) should be continued during dose escalation and for at least one month after reaching target urate and being flare-free.[1] Lifestyle modifications, including dietary changes, have only modest effects on urate levels. There is insufficient evidence supporting the efficacy of alkaline water or cherry juice for gout management.[1] Sauna use and sweating are not recognized interventions for gout. Referral to rheumatology is appropriate for diagnostic uncertainty, refractory disease, or consideration of alternative therapies. The following table summarizes recommended therapies, dosing, and contraindications for gout management, as detailed in the review:  Table 3. Therapies Used in the Management of Gout.*  Gout. Mikuls TR. Published November 2022 Used under license from [employer]. Building on the prior discussion of pharmacologic management, recent guidelines from the [employer] (ACR) provide further clarity on the initiation and titration of urate-lowering therapy (ULT) and flare prophylaxis. The ACR strongly recommends allopurinol  as the preferred first-line ULT, including for patients with chronic kidney disease, with a low starting dose (<=100 mg/day, or lower in CKD) and gradual titration to achieve a serum urate target of <6 mg/dL. Dose escalation should be guided by serial urate measurements, and larger body size may necessitate higher doses to reach target urate levels.[2-3] When starting ULT, concomitant anti-inflammatory prophylaxis with colchicine  (0.6 mg once or twice daily), NSAIDs, or prednisone /prednisolone is recommended for at least 3-6 months to reduce the risk of flares during urate lowering.[2-3] The  [employer] also supports colchicine , NSAIDs, or corticosteroids as first-line agents for acute flares, and notes that lower doses of colchicine  are as effective as higher doses with fewer adverse effects.[4] For patients with contraindications to NSAIDs and colchicine , FDA-approved canakinumab may be considered, though its use is reserved for refractory cases.[5] Regarding lifestyle and dietary interventions, the medical literature indicates that dietary changes alone have only modest effects on urate levels and do not reliably prevent flares.[4] While cherry juice and tart cherry supplements have shown an association with reduced flare frequency and lower serum urate in observational studies and small trials, the evidence is insufficient to recommend them as a primary therapy; they may be considered as adjuncts for motivated patients. Alkaline water is not supported by clinical evidence for gout management.[6-8] Alcohol remains a recognized trigger for gout flares. Both episodic and regular consumption of beer, spirits, and even moderate amounts of vodka can increase flare risk, and the literature supports limiting all types of alcohol intake for patients with established gout.[9-10] Sauna use and sweating are not recognized interventions for gout in the medical literature. Persistent forefoot pain after a flare may reflect ongoing inflammation or early chronic gout. Referral to rheumatology is appropriate for diagnostic uncertainty, refractory symptoms, or consideration of advanced therapies, as outlined in the literature.[11] Would you like me to review the latest evidence and recommendations regarding the safety and efficacy of initiating allopurinol  in patients with possible renal impairment or  atypical body composition, including optimal titration strategies and monitoring protocols? 1. Gout. Mikuls TR. The [place] Journal of Medicine. 2022;387(20):1877-1887.  doi:10.1056/NEJMcp2203385.  Leading Journal                                                         2. 2020 American College of Rheumatology Guideline for the Management of Gout. FitzGerald JD, Dalbeth N, Mikuls T, et al. Arthritis Care & Research. 2020;72(6):744-760. doi:10.1002/acr.75819. Practice Guideline   3. Management of Gout: Update from the [employer]. American Academy of Family Physicians [phone]) Practice Guideline         4. Management of Acute and Recurrent Gout: A Clinical Practice Guideline From the [employer]. Lamond DELENA Arloa SIBYL, [place] MA, et al. Annals of Internal Medicine. 2017;166(1):58-68. doi:10.7326/M16-0570. Practice Guideline  Leading Journal          5. FDA Orange Book. FDA Orange Book 6. Effectiveness of Cherries in Reducing Uric Acid and Gout: A Systematic Review. Laurence PE, Arminda SPECKING, Chien WH, Chien CW, Benson UTAH. Evidence-Based Complementary and Alternative Medicine : eCAM. 2019;2019:9896757. doi:10.1155/2019/9896757. 7. Efficacy and Safety of Tart Cherry Supplementary Citrate Mixture on Gout Patients: A Prospective, Randomized, Controlled Study. Cesario JAYSON Austin LELON Margie LOISE, et al. Arthritis Research & Therapy. 2023;25(1):164. doi:10.1186/s13075-023-03152-1. 8. Effect of Tart Cherry Juice on Risk of Gout Attacks: Protocol for a Randomised Controlled Trial. Baruch FUS, Macario DELENA Nelwyn JINNY Sharron ME. BMJ Open. 2020;10(3):e035108. doi:10.1136/bmjopen-2019-035108. 9. The Role of Alcohol Consumption in Pathogenesis of Gout. Nieradko-Iwanicka B. Critical Reviews in [employer] and Nutrition. 2022;62(25):7129-7137. doi:10.1080/10408398.2021.1911928.  Leading Journal  10. Dose-Response Relationship Between Alcohol Consumption and Gout Risk: Do Subtypes of Alcoholic Beverages Make a Difference?SABRA Laurence LELON Ursula CINDERELLA Austin OLEGARIO, et al. The Journal of Rheumatology.  2024;51(8):818-824. doi:10.3899/jrheum.7975-9934. 11. Management of Gout: A 65-Year-Old Man With a History of Podagra, Hyperuricemia, and Mild Renal Insufficiency. Shmerling RH. JAMA. 2012;308(20):2133-41. doi:10.1001/jama.7987.34971.  Leading Journal  Finished thinking NEJM Review According to an invited review in The [place] Journal of Medicine, credentialing is not specifically required to perform an aspiration of the right metatarsophalangeal joint for confirmation of gout; however, the procedure should be performed by a clinician trained in joint aspiration and familiar with the technique and interpretation of synovial fluid analysis for monosodium urate crystals.[1] The review emphasizes that joint aspiration is the diagnostic gold standard when feasible, particularly in cases of diagnostic uncertainty or atypical presentation. The rationale is that crystal identification under polarized microscopy provides definitive confirmation of gout, which guides management and excludes other causes of arthritis.[1]  Gout. Mikuls TR. Published November 2022 Used under license from [employer]. Building on the prior discussion, the medical literature clarifies that credentialing is not specifically required to perform an aspiration of the right metatarsophalangeal joint for confirmation of gout. However, the procedure should be performed by a clinician who is trained and experienced in joint aspiration and synovial fluid analysis, as improper technique can increase the risk of complications such as infection or inadequate sampling.[2-4] The [employer] recommends that synovial fluid analysis be performed when diagnostic uncertainty exists, provided the clinician can minimize patient discomfort and infection risk, and has access to reliable crystal identification by polarized microscopy.[2] In practice, primary care internists and other non-rheumatology  clinicians frequently perform small joint aspirations, including the first metatarsophalangeal joint, especially when supported by ultrasound guidance to improve accuracy and sample adequacy.[4] If the necessary expertise or equipment is unavailable, referral to a clinician or center with appropriate capabilities is advised. In summary, while formal credentialing is not mandated, joint aspiration for gout diagnosis should be performed by a clinician with appropriate procedural training and access to reliable laboratory analysis, in accordance with recommendations from the [employer].[2][4] Would you like me to summarize the evidence on the diagnostic accuracy and safety of metatarsophalangeal joint aspiration for gout, including complication rates and best practices for technique, as reported in recent clinical studies? 1. Gout. Mikuls TR. The [place] Journal of Medicine. 2022;387(20):1877-1887. doi:10.1056/NEJMcp2203385.  Leading Journal   2. Diagnosis of Acute Gout: A Clinical Practice Guideline From the [employer]. Lamond DELENA Shuck RM, Glade HERO, et al. Annals of Internal Medicine. 2017;166(1):52-57. doi:10.7326/M16-0569. Practice Guideline  Leading Journal    3. Gout on the Acute Medical Take. Abhishek A, Cipolletta E. Clinical Medicine [place], [place]). 2025;25(4):100331. doi:10.1016/j.clinme.2025.100331.  New Research 4. Use of Ultrasound by Internists to Improve Diagnostic Small Joint Aspiration. Charol ROSIN, Ward WJ. The American Journal of Medicine. 2017;130(2):234-236. doi:10.1016/j.amjmed.2016.08.034. Finished thinking The most common side effects of allopurinol  are rash and gastrointestinal symptoms (such as diarrhea and nausea). Rash can be mild but may rarely progress to severe cutaneous adverse reactions, including Stevens-Johnson syndrome and allopurinol  hypersensitivity syndrome, which are potentially life-threatening and  require immediate discontinuation of the drug. The risk of rash is increased in patients with renal insufficiency and in those carrying the HLA-B*5801 allele, particularly among individuals of [place], [place], or [place] descent with CKD.[1-2] Initiation of allopurinol  can precipitate acute gout flares, especially in the early months of therapy, due to mobilization of urate from tissue stores. This risk is mitigated by starting at a low dose (<=100 mg daily) and using anti-inflammatory prophylaxis (e.g., colchicine  0.5-0.6 mg daily) as recommended by the [employer].[3] Other less common side effects include liver enzyme elevations, headache, and, rarely, vasculitis or bone marrow suppression.[2] The patient's muscular build and BMI do not independently increase the risk of allopurinol  side effects, but dosing may need to be titrated to achieve target urate levels, as larger body size can require higher maintenance doses.[3] There is no evidence that dietary changes or avoidance of red meat and shellfish alter the side effect profile of allopurinol . In summary, the most important side effects to monitor for are rash (including severe hypersensitivity), gastrointestinal upset, and acute gout flares during initiation. Prophylactic anti-inflammatory therapy and gradual dose escalation are essential to minimize these risks, as emphasized by the [employer] and the [employer]	[place].[1][3] Would you like me to review the current recommendations for HLA-B*5801 screening prior to allopurinol  initiation, especially considering the patient's ethnicity and risk factors for hypersensitivity reactions? This could help guide safe prescribing practices. 1. Management of Acute and Recurrent Gout: A Clinical Practice Guideline From the [employer]. Lamond DELENA Arloa SIBYL, Elmo MA, et al. Annals of Internal Medicine. 2017;166(1):58-68.  doi:10.7326/M16-0570. Practice Guideline  Leading Journal       2. ZYLOPRIM . FDA Drug Label. Food and Drug Administration Updated date: 2008-04-11 3. 2020 [employer] Guideline for the Management of Gout. FitzGerald JD, Dalbeth N, Mikuls T, et al. Arthritis Care & Research. 2020;72(6):744-760. doi:10.1002/acr.75819. Practice Guideline         Finished thinking The following  document provides evidence-based comments to accompany referrals to rheumatology and podiatry for a patient with recurrent gout flares and persistent forefoot pain. The recommendations are grounded in current consensus from the [employer]	[place], [employer], and recent reviews in The [place] Journal of Medicine and JAMA, emphasizing the importance of diagnostic confirmation, individualized management, and multidisciplinary care for patients with complex or refractory gout presentations.[1-9] Gout Referral Comments This patient presents with recurrent acute gout flares, most recently involving the right first metatarsophalangeal (MTP) joint, with flare duration increasing from 3-4 days to 8-9 days, and persistent forefoot pain for several weeks post-flare. Clinical features are highly suggestive of gout, including monoarticular involvement of the first MTP, rapid onset of symptoms, and a history of dietary triggers (notably shellfish).[1] The diagnosis has not yet been confirmed by identification of monosodium urate crystals. Rheumatology referral comments:  Diagnostic confirmation is requested. If feasible, aspiration of the affected MTP joint for synovial fluid analysis and polarized microscopy is recommended, as crystal identification remains the gold standard for gout diagnosis, particularly in cases of diagnostic uncertainty or atypical presentation.[1-4][9]  The patient meets criteria for urate-lowering therapy (ULT) initiation due to recurrent flares  and persistent symptoms. Allopurinol  is preferred as first-line ULT, with a low starting dose and gradual titration to achieve serum urate <6.0 mg/dL, per [employer] guidelines.[1][6-7]  Anti-inflammatory prophylaxis (colchicine  0.5-0.6 mg daily) should be considered during ULT initiation and dose adjustment to reduce risk of flare.[1][6-7]  The patient expresses interest in further management options, including confirmation of diagnosis, optimal ULT initiation, and long-term disease control. Education regarding lifestyle modification and dietary interventions is requested, though these have modest effects on urate levels.[1][7-8]  Persistent forefoot pain may reflect ongoing inflammation, early chronic gout, or other pathology; evaluation for tophus, joint damage, or alternative diagnoses is warranted.[1-2][4][9] Podiatry referral comments:  The patient has ongoing forefoot pain following a prolonged gout flare, with functional limitations. Assessment for structural joint damage, tophus formation, and biomechanical factors contributing to pain is requested.[1][4]  Recommendations for offloading, orthotics, or other supportive interventions to optimize foot function and reduce pain are requested.  Collaboration with rheumatology is advised for integrated management of gout-related foot pathology. Additional context:  The patient has made dietary modifications, avoids red meat and shellfish, and limits alcohol intake, but continues to experience flares. BMI is 30 with a muscular build; waist circumference is 33.  No history of confirmed crystal diagnosis; no prior joint aspiration performed.  The patient is motivated for further evaluation and management, including consideration of advanced therapies if indicated. These comments are consistent with current evidence and guideline recommendations for multidisciplinary management of gout, emphasizing diagnostic confirmation,  treat-to-target urate lowering, and supportive care for persistent symptoms.[1-9]  References The document above provides comprehensive, evidence-based comments for rheumatology and podiatry referrals, supporting diagnostic confirmation and individualized management for a patient with recurrent gout and persistent forefoot pain. If additional details or specific questions arise during specialist evaluation, further guidance can be provided. Would you like me to summarize the latest evidence on the diagnostic yield and clinical impact of synovial fluid analysis for crystal identification in suspected gout, especially in cases where diagnosis remains uncertain? This could help clarify the value of joint aspiration prior to initiating long-term urate-lowering therapy. 1. Gout. Mikuls TR. The [place] Journal of Medicine. 2022;387(20):1877-1887. doi:10.1056/NEJMcp2203385.  Leading Journal              2. Management of Gout: A 38-Year-Old Man  With a History of Podagra, Hyperuricemia, and Mild Renal Insufficiency. Shmerling RH. JAMA. 2012;308(20):2133-41. doi:10.1001/jama.7987.34971.  Leading Journal  3. Diagnosis of Acute Gout: A Clinical Practice Guideline From the [employer]. Lamond DELENA Shuck RM, Glade HERO, et al. Annals of Internal Medicine. 2017;166(1):52-57. doi:10.7326/M16-0569. Practice Guideline  Leading Journal  4. Gout: Rapid Evidence Review. Clebak KT, Jesus DELENA, [place]. American Family Physician. 2020;102(9):533-538. 5. Management of Acute and Recurrent Gout: A Clinical Practice Guideline From the [employer]. Lamond DELENA Arloa SIBYL, Elmo MA, et al. Annals of Internal Medicine. 2017;166(1):58-68. doi:10.7326/M16-0570. Practice Guideline  Leading Journal    6. 2020 American College of Rheumatology Guideline for the Management of Gout. FitzGerald JD, Dalbeth N, Mikuls T, et al. Arthritis Care & Research.  2020;72(6):744-760. doi:10.1002/acr.75819. Practice Guideline 7. Diagnosis, Treatment, and Prevention of Gout. Hainer BL, Majel FORBES Hone RT. American Family Physician. 2014;90(12):831-6. 8. How Would You Manage This Patient With Gout?: Grand Rounds Discussion From [facility]. Burns RB, Smith CC, Shmerling RH, Tess A. Annals of Internal Medicine. 2018;169(11):788-795. doi:10.7326/M18-2548.  Leading Journal  9. Casebook Consults: Improving Outcomes in Gout (Multimedia Activity). Doghramji PP, Mandell BF, Pope RS. The American Journal of Medicine. 2012;125(8):S1. doi:10.1016/j.amjmed.2012.06.002. Finished thinking The following after-visit summary is written for a patient starting allopurinol  and colchicine  for recurrent gout, with guidance on prednisone  use and follow-up. The recommendations are grounded in the [employer] guidelines and recent reviews in The [place] Journal of Medicine, which support low-dose allopurinol  initiation with anti-inflammatory prophylaxis, and provide clear criteria for adding glucocorticoids if flares occur.[1-3] Gout After-Visit Summary Diagnosis and Plan:  Clinical presentation is consistent with gout, with recurrent flares involving the right first metatarsophalangeal joint and persistent forefoot pain. Crystal confirmation is pending.  Indication for urate-lowering therapy (ULT) is established due to recurrent flares.[1-2] Medication Instructions:  Allopurinol : Start 100 mg orally once daily, beginning this weekend. This is the recommended initial dose for ULT, per [employer] guidelines, to minimize risk of hypersensitivity and flare.[2]  Dose will be titrated every 3-6 weeks based on serum urate, aiming for <6.0 mg/dL.[1-2]  Continue indefinitely unless contraindications or adverse reactions develop.  Colchicine : Start 0.6 mg orally once daily, beginning concurrently with  allopurinol . This serves as anti-inflammatory prophylaxis to reduce risk of gout flares during ULT initiation and titration.[1-3]  Continue for at least 3-6 months after starting allopurinol , or until serum urate target is reached and flare-free for at least 1 month.[1-2]  If a flare occurs, increase colchicine  to 1.2 mg at onset, then 0.6 mg one hour later, followed by 0.6 mg once or twice daily for up to 7-10 days, as tolerated.[1-2][4] Prednisone  Guidance:  Prednisone  is reserved for management of acute flares if colchicine  is ineffective, not tolerated, or contraindicated.[1-2][4]  If a flare develops and colchicine  is not sufficient, initiate prednisone  at 0.5 mg/kg/day (typically 30-40 mg daily), then taper over 7-10 days.[1-2][4]  Avoid prednisone  in poorly controlled diabetes, active infection, or other contraindications.[1]  Do not use prednisone  for flare prophylaxis unless specifically directed. Lifestyle and Education:  Dietary modifications (limiting red meat, shellfish, alcohol) have modest impact on urate levels and flare risk.[1][4]  Maintain hydration and avoid known dietary triggers.  Continue regular exercise as tolerated. Monitoring and Follow-Up:  Schedule follow-up in 3-6 weeks for assessment of serum urate, renal and liver function, and review of medication tolerance.[1-2][5]  Return earlier if rash, severe gastrointestinal symptoms, or signs of hypersensitivity (fever, mucosal  involvement) develop.[6]  If a flare occurs, contact clinic for further management and possible adjustment of anti-inflammatory therapy.  Long-term monitoring will include periodic laboratory assessment and dose titration of allopurinol  to maintain target urate.[1-2][5] Referral:  Rheumatology and podiatry referrals are in process for diagnostic confirmation and management of persistent forefoot pain. When to Return:  Return for next visit in 3-6 weeks, or sooner if experiencing adverse effects, new  or worsening symptoms, or difficulty managing a flare. Patient Engagement:  Education on gout pathophysiology, medication adherence, and flare management provided. Reinforced importance of ongoing communication regarding symptoms and medication side effects.[1-2]  References This after-visit summary provides evidence-based instructions for gout management, including medication initiation, flare management, and follow-up, consistent with current consensus guidelines. Additional guidance can be provided as needed for dose titration or adverse event monitoring. Would you like me to review the latest evidence on optimal duration and dosing of colchicine  prophylaxis during urate-lowering therapy initiation, to ensure your patient's regimen aligns with best practices and minimizes risk of flare or toxicity? 1. Gout. Mikuls TR. The [place] Journal of Medicine. 2022;387(20):1877-1887. doi:10.1056/NEJMcp2203385.  Leading Journal                             2. 2020 American College of Rheumatology Guideline for the Management of Gout. FitzGerald JD, Dalbeth N, Mikuls T, et al. Arthritis Care & Research. 2020;72(6):744-760. doi:10.1002/acr.75819. Practice Guideline              3. Management of Gout: Update from the [employer]. American Academy of Family Physicians [phone]) Practice Guideline         4. Management of Acute and Recurrent Gout: A Clinical Practice Guideline From the [employer]. Lamond DELENA Arloa SIBYL, Elmo MA, et al. Annals of Internal Medicine. 2017;166(1):58-68. doi:10.7326/M16-0570. Practice Guideline  Leading Journal        5. Management of Gout: A 59-Year-Old Man With a History of Podagra, Hyperuricemia, and Mild Renal Insufficiency. Shmerling RH. JAMA. 2012;308(20):2133-41. doi:10.1001/jama.7987.34971.  Leading Journal  6. ZYLOPRIM . FDA Drug Label. Food and Drug  Administration Updated date: 2008-04-11 Charlies thinking The following document provides evidence-based counseling regarding the proposal for intermittent use of allopurinol  to allow consumption of beer and shellfish. The medical literature, including guidelines from the [employer] and [employer]	[place], strongly supports continuous urate-lowering therapy for patients with recurrent gout, and highlights the risks of suboptimal adherence and dietary triggers. Simulation studies further demonstrate that intermittent or drug holiday patterns with allopurinol  are associated with poor urate control and increased flare risk.[1-5] Allopurinol  Intermittent Use Counseling Clinical context: The patient requests a treatment plan involving intermittent use of allopurinol  to permit consumption of beer and shellfish. Evidence-based counseling:  The [employer] and [employer] recommend continuous, daily urate-lowering therapy (ULT) for patients with recurrent gout (>=2 flares/year), with allopurinol  as first-line agent. The goal is to maintain serum urate consistently below 6.0 mg/dL to prevent flares and long-term joint damage.[1][3-4][6]  Intermittent use of allopurinol  (e.g., stopping and restarting around dietary indiscretions) is not supported by clinical guidelines or pharmacokinetic data. Simulation studies show that repeated drug holidays of more than 3 days, especially when followed by less than 15 consecutive days of dosing, result in poor urate control and a marked increase in gout flare frequency.[5]  Allopurinol  does not acutely protect against dietary triggers. Serum urate levels respond slowly to changes in allopurinol   dosing, and abrupt cessation or resumption does not prevent flares associated with high-purine foods or alcohol.[1][5]  Consumption of beer and shellfish is associated with increased risk of gout flares due to  their high purine content and effect on uric acid metabolism. Guidelines conditionally recommend limiting intake of these foods and alcohol for all patients with gout, regardless of disease activity.[1-2][4][7]  Intermittent allopurinol  use may also increase the risk of adverse drug reactions, including hypersensitivity, and does not allow for safe titration or monitoring of serum urate.[4]  Patients who are unable or unwilling to adhere to continuous ULT should be counseled that this approach is likely to result in more frequent and severe flares, increased risk of chronic joint damage, and reduced quality of life.[1][3][5]  Shared decision-making is essential. If the patient elects not to pursue continuous ULT, management should focus on flare prophylaxis, dietary counseling, and close monitoring for complications. The decision can be revisited if the patient experiences increased flare frequency or severity.[3][6][8] Summary for documentation:  Intermittent allopurinol  use is not recommended for gout management.  Continuous daily therapy is required to maintain urate control and prevent flares.  Consumption of beer and shellfish should be limited, as they are established triggers.  Patient counseled on risks of intermittent therapy and importance of adherence to guideline-based management.  References This document provides evidence-based counseling on the risks of intermittent allopurinol  use and dietary triggers in gout, supporting continuous therapy and lifestyle modification as recommended by current guidelines. If further patient education or shared decision-making tools are needed, additional resources can be provided. Would you like me to summarize the latest evidence on alternative urate-lowering therapies or adjunctive strategies for patients who struggle with adherence to allopurinol , including options with different dosing schedules or tolerability profiles? 1. Gout. Mikuls TR. The [place] Journal of Medicine. 2022;387(20):1877-1887. doi:10.1056/NEJMcp2203385.  Leading Journal  2. Diagnosis, Treatment, and Prevention of Gout. Hainer BL, Majel FORBES Hone RT. American Family Physician. 2014;90(12):831-6. 3. Management of Acute and Recurrent Gout: A Clinical Practice Guideline From the [employer]. Lamond DELENA Arloa SIBYL, Elmo MA, et al. Annals of Internal Medicine. 2017;166(1):58-68. doi:10.7326/M16-0570. Practice Guideline  Leading Journal       4. 2020 American College of Rheumatology Guideline for the Management of Gout. FitzGerald JD, Dalbeth N, Mikuls T, et al. Arthritis Care & Research. 2020;72(6):744-760. doi:10.1002/acr.75819. Practice Guideline                5. Relative Forgiveness of Different Allopurinol  Implementation Patterns in People With Gout and Their Impact on Clinical Outcomes: A Simulation Study. [place] CHRISTELLA Silvan DFB, Hughes DA, et al. Clinical Pharmacokinetics. 2025;64(1):93-105. doi:10.1007/[id number]-z.  New Research 6. Management of Gout: Update from the [employer]. American Academy of Family Physicians [phone]) Practice Guideline  7. KDIGO 2024 Clinical Practice Guideline for the Evaluation and Management of Chronic Kidney Disease. Kidney International. 2024;105(4S):S117-S314. doi:10.1016/j.kint.2023.10.018. Practice Guideline  Leading Journal    8. How Would You Manage This Patient With Gout?: Grand Rounds Discussion From [facility]. Burns RB, Smith CC, Shmerling RH, Tess A. Annals of Internal Medicine. 2018;169(11):788-795. doi:10.7326/M18-2548.  Leading Journal  Finished thinking Allopurinol , colchicine , and prednisone  are generally not approved for use by pilots according to aviation medical regulations. Allopurinol  may cause drowsiness and other side effects that can impair alertness, which is specifically noted in the FDA  labeling as a precaution for activities requiring full attention, such as piloting aircraft.[1] Colchicine  is associated with gastrointestinal symptoms, fatigue, and,  rarely, neuromuscular toxicity, which can also affect performance and alertness.[2-4] Prednisone  and other systemic corticosteroids can cause mood changes, dysphoria, and neurocognitive effects, as well as immune suppression and fluid retention, all of which are considered disqualifying for aviation duties by most regulatory authorities.[2][5-6] Aviation medical examiners typically require that pilots be free from medications or medical conditions that could impair cognitive or physical function, and these drugs are not considered compatible with safe flight operations. Pilots taking these medications should be grounded until therapy is completed and side effects have resolved, and clearance from an aviation medical examiner is obtained. Would you like me to review the specific guidelines or waivers from major aviation authorities (such as the FAA or EASA) regarding the use of these medications, including any circumstances under which exceptions might be granted? 1. ZYLOPRIM . FDA Drug Label. Food and Drug Administration Updated date: 2008-04-11 2. Management of Acute and Recurrent Gout: A Clinical Practice Guideline From the [employer]. Lamond DELENA Arloa SIBYL, Elmo MA, et al. Annals of Internal Medicine. 2017;166(1):58-68. doi:10.7326/M16-0570. Practice Guideline  Leading Journal      3. Colchicine  in Clinical Medicine. A Guide for Internists. Cocco G, Chu DC, Pandolfi S. European Journal of Internal Medicine. 2010;21(6):503-8. doi:10.1016/j.ejim.2010.09.010. 4. Colchicine  - New Horizons for an Ancient Drug. Review Based on the Highest Hierarchy of Evidence. Schattner A. European Journal of Internal Medicine. 2022;96:34-41. doi:10.1016/j.ejim.2021.10.002. 5. Deltasone . FDA Drug Label. Food and Drug  Administration Updated date: 2017-03-14 6. PEDIAPRED. FDA Drug Label. Food and Drug Administration Updated date: 2022-06-13 Finished thinking Continuous daily use of allopurinol  is not categorically disapproved for pilots by aviation medical regulations, but it is subject to significant restrictions and requires case-by-case assessment. The FDA labeling for allopurinol  (ZYLOPRIM ) specifically cautions that, due to the occasional occurrence of drowsiness, patients should take precautions when engaging in activities where alertness is mandatory, such as piloting aircraft.[1] This means that while allopurinol  is not absolutely contraindicated, its use is generally disfavored for pilots unless the individual is closely monitored and demonstrates no adverse effects that could impair alertness or performance. Aviation medical examiners typically require that any medication with potential central nervous system effects, such as drowsiness, be carefully evaluated before a pilot is cleared for flight duties. The presence of side effects such as rash, hypersensitivity, or drowsiness would be disqualifying. Therefore, pilots on allopurinol  must be free of side effects and may require special authorization or a waiver, depending on the regulatory authority and individual circumstances.[1] In summary, continuous daily use of allopurinol  is not automatically disqualifying, but it is not on an unrestricted approved list for pilots and requires individualized assessment and clearance by an Investment banker, operational.[1] Would you like me to review the specific waiver or special authorization processes and criteria used by major aviation regulatory bodies (such as the FAA or EASA) for pilots requiring allopurinol , including any published guidance or case series on outcomes? 1. ZYLOPRIM . FDA Drug Label. Food and Drug Administration Updated date: 2008-04-11 Follow-Up Questions

## 2024-02-03 LAB — URIC ACID: Uric Acid, Serum: 6.9 mg/dL (ref 4.0–7.8)

## 2024-02-05 ENCOUNTER — Ambulatory Visit: Payer: Self-pay | Admitting: Internal Medicine

## 2024-03-11 ENCOUNTER — Other Ambulatory Visit: Payer: Self-pay | Admitting: Internal Medicine

## 2024-03-11 DIAGNOSIS — E781 Pure hyperglyceridemia: Secondary | ICD-10-CM

## 2024-03-11 DIAGNOSIS — E785 Hyperlipidemia, unspecified: Secondary | ICD-10-CM

## 2024-03-11 DIAGNOSIS — Z8249 Family history of ischemic heart disease and other diseases of the circulatory system: Secondary | ICD-10-CM

## 2024-03-12 ENCOUNTER — Encounter: Payer: Self-pay | Admitting: Internal Medicine

## 2024-03-12 ENCOUNTER — Ambulatory Visit (INDEPENDENT_AMBULATORY_CARE_PROVIDER_SITE_OTHER): Payer: BC Managed Care – PPO | Admitting: Internal Medicine

## 2024-03-12 VITALS — BP 122/84 | HR 57 | Temp 97.2°F | Ht 70.0 in | Wt 210.8 lb

## 2024-03-12 DIAGNOSIS — E781 Pure hyperglyceridemia: Secondary | ICD-10-CM | POA: Diagnosis not present

## 2024-03-12 DIAGNOSIS — E669 Obesity, unspecified: Secondary | ICD-10-CM

## 2024-03-12 DIAGNOSIS — M109 Gout, unspecified: Secondary | ICD-10-CM

## 2024-03-12 DIAGNOSIS — Z8249 Family history of ischemic heart disease and other diseases of the circulatory system: Secondary | ICD-10-CM

## 2024-03-12 DIAGNOSIS — L659 Nonscarring hair loss, unspecified: Secondary | ICD-10-CM | POA: Diagnosis not present

## 2024-03-12 DIAGNOSIS — Z0001 Encounter for general adult medical examination with abnormal findings: Secondary | ICD-10-CM

## 2024-03-12 DIAGNOSIS — Z79899 Other long term (current) drug therapy: Secondary | ICD-10-CM | POA: Diagnosis not present

## 2024-03-12 DIAGNOSIS — E785 Hyperlipidemia, unspecified: Secondary | ICD-10-CM

## 2024-03-12 DIAGNOSIS — K029 Dental caries, unspecified: Secondary | ICD-10-CM

## 2024-03-12 LAB — LIPID PANEL
Cholesterol: 166 mg/dL (ref 0–200)
HDL: 40 mg/dL (ref >39.00)
LDL Cholesterol: 90 mg/dL (ref 0–99)
NonHDL: 126.38
Total CHOL/HDL Ratio: 4
Triglycerides: 184 mg/dL — ABNORMAL HIGH (ref 0.0–149.0)
VLDL: 36.8 mg/dL (ref 0.0–40.0)

## 2024-03-12 LAB — CBC WITH DIFFERENTIAL/PLATELET
Basophils Absolute: 0 K/uL (ref 0.0–0.1)
Basophils Relative: 0.8 % (ref 0.0–3.0)
Eosinophils Absolute: 0.2 K/uL (ref 0.0–0.7)
Eosinophils Relative: 4.5 % (ref 0.0–5.0)
HCT: 49.5 % (ref 39.0–52.0)
Hemoglobin: 17.1 g/dL — ABNORMAL HIGH (ref 13.0–17.0)
Lymphocytes Relative: 39.3 % (ref 12.0–46.0)
Lymphs Abs: 1.9 K/uL (ref 0.7–4.0)
MCHC: 34.6 g/dL (ref 30.0–36.0)
MCV: 90.2 fl (ref 78.0–100.0)
Monocytes Absolute: 0.4 K/uL (ref 0.1–1.0)
Monocytes Relative: 8.6 % (ref 3.0–12.0)
Neutro Abs: 2.3 K/uL (ref 1.4–7.7)
Neutrophils Relative %: 46.8 % (ref 43.0–77.0)
Platelets: 153 K/uL (ref 150.0–400.0)
RBC: 5.49 Mil/uL (ref 4.22–5.81)
RDW: 13.5 % (ref 11.5–15.5)
WBC: 4.9 K/uL (ref 4.0–10.5)

## 2024-03-12 LAB — COMPREHENSIVE METABOLIC PANEL WITH GFR
ALT: 55 U/L — ABNORMAL HIGH (ref 0–53)
AST: 37 U/L (ref 0–37)
Albumin: 5.2 g/dL (ref 3.5–5.2)
Alkaline Phosphatase: 36 U/L — ABNORMAL LOW (ref 39–117)
BUN: 18 mg/dL (ref 6–23)
CO2: 26 meq/L (ref 19–32)
Calcium: 9.8 mg/dL (ref 8.4–10.5)
Chloride: 103 meq/L (ref 96–112)
Creatinine, Ser: 1.13 mg/dL (ref 0.40–1.50)
GFR: 86.04 mL/min (ref >60.00)
Glucose, Bld: 79 mg/dL (ref 70–99)
Potassium: 4 meq/L (ref 3.5–5.1)
Sodium: 139 meq/L (ref 135–145)
Total Bilirubin: 1.9 mg/dL — ABNORMAL HIGH (ref 0.2–1.2)
Total Protein: 7.4 g/dL (ref 6.0–8.3)

## 2024-03-12 LAB — HEMOGLOBIN A1C: Hgb A1c MFr Bld: 4.8 % (ref 4.6–6.5)

## 2024-03-12 LAB — URIC ACID: Uric Acid, Serum: 8.9 mg/dL — ABNORMAL HIGH (ref 4.0–7.8)

## 2024-03-12 MED ORDER — FINASTERIDE 1 MG PO TABS: 1.0000 mg | ORAL_TABLET | Freq: Every day | ORAL | 3 refills | Status: AC

## 2024-03-12 NOTE — Assessment & Plan Note (Signed)
 Hypercholesterolemia is managed with rosuvastatin . There is a family history of myocardial infarction and high cholesterol. A lipoprotein fractionation test was discussed to better assess cardiovascular risk and guide treatment adjustments. Insurance coverage for the test is uncertain, and it may require self-pay. This test can provide a clearer picture of cardiovascular risk by fractionating cholesterol into smaller parts, potentially guiding adjustments in rosuvastatin  or adding new medications. The importance of individualized risk assessment and treatment was emphasized. Lipoprotein fractionation and lipoprotein A tests will be ordered as part of the annual CPE, with a self-pay option if insurance does not cover. A regular cholesterol panel will be performed to assess current treatment efficacy.

## 2024-03-12 NOTE — Assessment & Plan Note (Signed)
 Likely gout, Shared decision-making done; patient understood rationale and agreed to labwork uric acidGout is managed with chronic therapy. Uric acid levels will be checked as part of routine monitoring.

## 2024-03-12 NOTE — Patient Instructions (Addendum)
 VISIT SUMMARY: During your annual physical exam, we discussed your general health, dietary habits, exercise routine, and family history of heart problems. We also reviewed your current medications and addressed your concerns about chest pain, weight loss, and a dental issue.  YOUR PLAN: -ADULT WELLNESS VISIT: We conducted a thorough physical examination and discussed your diet and exercise habits. You are actively going to the gym, so no additional counseling on weight loss or exercise strategies is needed. We will perform a general health panel, including tests for cholesterol, kidney and liver function, blood counts, thyroid function, and possibly A1c if covered by insurance. Uric acid levels will also be checked as part of your routine monitoring for gout.  -HYPERCHOLESTEROLEMIA: Hypercholesterolemia means having high cholesterol levels in the blood. We are managing this with rosuvastatin . Given your family history of heart problems, we discussed a lipoprotein fractionation test to better assess your cardiovascular risk. This test may not be covered by insurance and could require self-pay. We will also perform a regular cholesterol panel to evaluate the effectiveness of your current treatment.  -GOUT: Gout is a form of arthritis characterized by severe pain, redness, and tenderness in joints. We are managing this with chronic therapy and will check your uric acid levels as part of routine monitoring.  -OVERWEIGHT: Being overweight means having a body mass index (BMI) higher than the normal range. We discussed your dietary habits and exercise routine, and I encouraged you to continue with your healthy eating and exercise. You shared a healthy recipe for cauliflower florets, which is great.  -NONSCARRING HAIR LOSS: Nonscarring hair loss means losing hair without any visible scarring on the scalp. We are managing this with finasteride .  -DENTAL CAVITY, RIGHT UPPER WISDOM TOOTH: You have a possible  dental cavity or large filling in your right upper wisdom tooth. Although you are not experiencing any pain, I recommend a dental evaluation to address this issue.  INSTRUCTIONS: Please follow up with the recommended dental evaluation for your right upper wisdom tooth. We will perform the general health panel and specific tests for cholesterol and uric acid levels. If the lipoprotein fractionation test is not covered by insurance, you may need to consider self-pay. Continue with your current medications and maintain your healthy diet and exercise routine. Schedule a follow-up appointment to review your test results and discuss any further treatment adjustments.  Building Your Long-Term Health Plan  During today's preventive visit, we covered a variety of important health checks to help you stay on top of your well-being.  We also discussed strategies to maintain your health and identified some areas that might benefit from further exploration.   Preventive care visits like today's are designed to be proactive, but sometimes additional attention may be needed.  Rest assured, we're here for you.  If these areas require further evaluation or management, we'd be happy to schedule a separate, focused appointment to address them in detail.  Addressing Next Steps  [x]   Follow-up Visit: To ensure we address any unresolved issues and continue monitoring your overall health, we recommend scheduling a follow-up appointment in 1 year for your next preventive care visit. If you experience any new problems, need to discuss any medical concerns, or your condition worsens before then, please don't hesitate to call our office to schedule an appointment or seek emergency care as needed.  [x]   Preventive Measures: Maintaining healthy habits plays a crucial role in overall wellness. We recommend considering these tips: [x]   Regular appointments with dental and  vision professionals [x]   Nightly nasal saline mist to keep  sinuses clear [x]   Consistent toothbrushing to maintain oral health [x]   Using an app like SnoreLab to track sleep quality [x]   Routine checks of blood pressure and heart rate [x]   Medical Information: In some instances, we may require additional medical information from other providers to create a comprehensive picture of your health. If applicable, we can provide a medical information release form at the front desk for you to sign, allowing us  to gather these records. [x]   Lab Tests: If any lab tests were ordered today, scheduling them within a week of your visit helps ensure the best possible insurance coverage.  Planning Follow Up to Work on a Problem? Make the Most of Our Focused (20 minute) Appointments  [x]   Clearly state your top concerns at the beginning of the visit to focus our discussion [x]   If you anticipate you will need more time, please inform the front desk during scheduling - we can book multiple appointments in the same week. [x]   If you have transportation problems- use our convenient video appointments or ask about transportation support. [x]   We can get down to business faster if you use MyChart to update information before the visit and submit non-urgent questions before your visit. Thank you for taking the time to provide details through MyChart.  Let our nurse know and she can import this information into your encounter documents.  Arrival and Wait Times  [x]   Arriving on time ensures that everyone receives prompt attention. [x]   Early morning (8a) and afternoon (1p) appointments tend to have shortest wait times. [x]   Unfortunately, we cannot delay appointments for late arrivals or hold slots during phone calls.  Bring to Your Next Appointment:  [x]   Medications: Please bring all your medication bottles to your next appointment to ensure we have an accurate record of your prescriptions. [x]   Health Diaries: If you're monitoring any health conditions at home, keeping a  diary of your readings can be very helpful for discussions at your next appointment.  Reviewing Your Records  [x]   Review your attached preventive care information at the end of these patient instructions. [x]   Review this early draft of your clinical encounter notes below and the final encounter summary tomorrow on MyChart after its been completed.      Getting Answers and Following Up  [x]   Simple Questions & Concerns: For quick questions or basic follow-up after your visit, reach us  at [phone] or MyChart messaging. [x]   Complex Concerns: If your concern is more complex, scheduling an appointment might be best. Discuss this with the staff to find the most suitable option. [x]   Lab & Imaging Results: We'll contact you directly if results are abnormal or you don't use MyChart. Most normal results will be on MyChart within 2-3 business days, with a review message from Dr. Jesus. Haven't heard back in 2 weeks? Need results sooner? Contact us  at [phone]. [x]   Referrals: Our referral coordinator will manage specialist referrals. The specialist's office should contact you within 2 weeks to schedule an appointment. Call us  if you haven't heard from them after 2 weeks.  Staying Connected  [x]   MyChart: Activate your MyChart for the fastest way to access results and message us . See the last page of this paperwork for instructions on how to activate.  Billing  [x]   X-ray & Lab Orders: These are billed by separate companies. Contact the invoicing company directly for questions  or concerns. [x]   Visit Charges: Discuss any billing inquiries with our administrative services team.  Your Satisfaction Matters  [x]   Share Your Experience: We strive for your satisfaction! If you have any complaints, or preferably compliments, please let Dr. Jesus know directly or contact our Practice Administrators, Manuelita Rubin or [employer], by asking at the front desk.                  Next Steps  [x]   Schedule Follow-Up:  We recommend a follow-up appointment in 1 year for your next wellness visit.  If you develop any new problems, want to address any medical issues, or your condition worsens before then, please call us  for an appointment or seek emergency care. [x]   Preventive Care:  Make sure to keep regular appointments with dental and vision professionals, use nightly nasal saline mist sprays to keep your sinuses clear and toothbrushing to protect your teeth. Use SnoreLab App or other app to track your sleep quality. Check blood pressure and heart rate routinely. [x]   Medical Information Release:  For any relevant medical information we don't have, please sign a release form at the front desk so we can obtain it for your records. [x]   Lab Tests:  Schedule any lab tests from today for within a week to ensure best insurance coverage.    Making the Most of Our Focused (20 minute) Appointments:  [x]   Clearly state your top concerns at the beginning of the visit to focus our discussion [x]   If you anticipate you will need more time, please inform the front desk during scheduling - we can book multiple appointments in the same week. [x]   If you have transportation problems- use our convenient video appointments or ask about transportation support. [x]   We can get down to business faster if you use MyChart to update information before the visit and submit non-urgent questions before your visit. Thank you for taking the time to provide details through MyChart.  Let our nurse know and she can import this information into your encounter documents.  Arrival and Wait Times: [x]   Arriving on time ensures that everyone receives prompt attention. [x]   Early morning (8a) and afternoon (1p) appointments tend to have shortest wait times. [x]   Unfortunately, we cannot delay appointments for late arrivals or hold slots during phone calls.  Bring to Your Next  Appointment  [x]   Medications: Please bring all your medication bottles to your next appointment to ensure we have an accurate record of your prescriptions. [x]   Health Diaries: If you're monitoring any health conditions at home, keeping a diary of your readings can be very helpful for discussions at your next appointment.  Reviewing Your Records  [x]   Review your attached preventive care information at the end of these patient instructions. [x]   Review this early draft of your clinical encounter notes below and the final encounter summary tomorrow on MyChart after its been completed.   Encounter for annual general medical examination with abnormal findings in adult -     Lipoprotein Fractionation, NMR with Lipid Panel (Triglycerides/HDL-C) -     Lipoprotein A (LPA) -     Lipid panel -     Comprehensive metabolic panel with GFR -     CBC with Differential/Platelet -     TSH Rfx on Abnormal to Free T4 -     Hemoglobin A1c -     Uric acid  Hair loss -     Finasteride ; Take 1  tablet (1 mg total) by mouth daily.  Dispense: 90 tablet; Refill: 3  Dyslipidemia -     Lipoprotein Fractionation, NMR with Lipid Panel (Triglycerides/HDL-C) -     Lipoprotein A (LPA) -     Lipid panel  Hypertriglyceridemia -     Lipoprotein Fractionation, NMR with Lipid Panel (Triglycerides/HDL-C) -     Lipoprotein A (LPA) -     Lipid panel  Family history of heart attack -     Lipoprotein Fractionation, NMR with Lipid Panel (Triglycerides/HDL-C) -     Lipoprotein A (LPA) -     Lipid panel  Podagra -     Uric acid  Medication management -     Lipoprotein Fractionation, NMR with Lipid Panel (Triglycerides/HDL-C) -     Lipoprotein A (LPA) -     Lipid panel -     Comprehensive metabolic panel with GFR -     CBC with Differential/Platelet -     TSH Rfx on Abnormal to Free T4 -     Hemoglobin A1c -     Uric acid  Obesity due to energy imbalance  Dental caries     Getting Answers and Following  Up  [x]   Simple Questions & Concerns: For quick questions or basic follow-up after your visit, reach us  at [phone] or MyChart messaging. [x]   Complex Concerns: If your concern is more complex, scheduling an appointment might be best. Discuss this with the staff to find the most suitable option. [x]   Lab & Imaging Results: We'll contact you directly if results are abnormal or you don't use MyChart. Most normal results will be on MyChart within 2-3 business days, with a review message from Dr. Jesus. Haven't heard back in 2 weeks? Need results sooner? Contact us  at [phone]. [x]   Referrals: Our referral coordinator will manage specialist referrals. The specialist's office should contact you within 2 weeks to schedule an appointment. Call us  if you haven't heard from them after 2 weeks.  Staying Connected  [x]   MyChart: Activate your MyChart for the fastest way to access results and message us . See the last page of this paperwork for instructions on how to activate.  Billing  [x]   X-ray & Lab Orders: These are billed by separate companies. Contact the invoicing company directly for questions or concerns. [x]   Visit Charges: Discuss any billing inquiries with our administrative services team.  Your Satisfaction Matters  [x]   Share Your Experience: We strive for your satisfaction! If you have any complaints, or preferably compliments, please let Dr. Jesus know directly or contact our Practice Administrators, Manuelita Rubin or [employer], by asking at the front desk.    Medical Screening Exam A medical screening exam (MSE) helps to determine whether you need immediate medical treatment relating to any number of symptoms you are having. This type of exam may be done in an emergency department, an urgent care setting, or your health care provider's office. Depending on your symptoms and severity, you may need additional tests or medical therapy. It is important to note that an  MSE does not necessarily mean that you will need or receive further medical testing or interventions if your symptoms are not deemed to be medically urgent (emergent). Tell a health care provider about: Any allergies you have. All medicines you are taking, including vitamins, herbs, eye drops, creams, and over-the-counter medicines. Any problems you or family members have had with anesthetic medicines. Any bleeding problems you have. Any surgeries you have  had. Any medical conditions you have. Whether you are pregnant or may be pregnant. What happens during the test? During the exam, a health care provider does a short, often focused, physical exam and asks about your medical history to assess: Your current symptoms. Your overall health. Your need for possible further medical intervention. What can I expect after the test? If you have a regular health care provider, make an appointment for a follow-up visit with him or her. If you do not have a regular health care provider, ask about resources in your community. Your medical screening exam may determine that: You do not need emergency treatment at this time. You need treatment right away. You need to be transferred to another medical center. This may happen if you need an emergent specialist or consultant that is not available at the medical center you are at. You need to have more tests. A medical specialist may be consulted if needed. Get help right away if: Your condition gets worse. You develop new or troubling symptoms before you see your health care provider. These symptoms may represent a serious problem that is an emergency. Do not wait to see if the symptoms will go away. Get medical help right away. Call your local emergency services (911 in the [place] Do not drive yourself to the hospital. Summary A medical screening exam helps to determine whether you need medical treatment right away. This type of exam may be done in an  emergency department, an urgent care setting, or your health care provider's office. During the exam, a health care provider does a short physical exam and asks about your current symptoms and overall health. Depending on the exam, more tests or therapies may be ordered. However, an MSE does not necessarily mean that you will have further medical testing if your symptoms are not deemed to be urgent. If you need further care that is not offered at your current medical center, you may need to be transferred to another facility. This information is not intended to replace advice given to you by your health care provider. Make sure you discuss any questions you have with your health care provider. Document Revised: 01/10/2021 Document Reviewed: 09/07/2020 Elsevier Patient Education  2024 [employer].

## 2024-03-12 NOTE — Assessment & Plan Note (Signed)
 Nonscarring hair loss is managed with finasteride .

## 2024-03-12 NOTE — Progress Notes (Signed)
 [facility] [address] [place], [place] [address] Office:  [phone]  -- Annual Preventive Medical Office Visit --  Patient:  Devin Jenkins      Age: 33 y.o.       Sex:  male  Date:   03/12/2024 Patient Care Team: Jesus Bernardino MATSU, MD as PCP - General (Internal Medicine) Curtis Debby PARAS, MD (Inactive) as Consulting Physician (Aerospace Medicine) Today's Healthcare Provider: Bernardino MATSU Jesus, MD  ========================================= Chief complaint: Annual Exam (Annual exam with PCP )  Purpose of Visit: Comprehensive preventive health assessment and personalized health maintenance planning.  This encounter was conducted as a Comprehensive Physical Exam (CPE) preventive care annual visit. The patient's medical history and problem list were reviewed to inform individualized preventive care recommendations.   No problem-specific medical treatment was provided during this visit.  Assessment & Plan Encounter for annual general medical examination with abnormal findings in adult An annual physical examination was conducted, focusing on general health and wellness, including dietary habits and exercise. No vaccinations were requested. He is actively going to the gym, so no additional counseling on weight loss or exercise strategies is needed. A general health panel will be performed through insurance, including cholesterol, kidney and liver function tests, blood counts, thyroid function, and potentially A1c if covered. Uric acid levels will be checked as part of routine monitoring for gout. Hair loss Nonscarring hair loss is managed with finasteride . Medication management Family history of heart attack Dyslipidemia Hypertriglyceridemia Hypercholesterolemia is managed with rosuvastatin . There is a family history of myocardial infarction and high cholesterol. A lipoprotein fractionation test was discussed to better assess cardiovascular risk and  guide treatment adjustments. Insurance coverage for the test is uncertain, and it may require self-pay. This test can provide a clearer picture of cardiovascular risk by fractionating cholesterol into smaller parts, potentially guiding adjustments in rosuvastatin  or adding new medications. The importance of individualized risk assessment and treatment was emphasized. Lipoprotein fractionation and lipoprotein A tests will be ordered as part of the annual CPE, with a self-pay option if insurance does not cover. A regular cholesterol panel will be performed to assess current treatment efficacy. Podagra Likely gout, Shared decision-making done; patient understood rationale and agreed to labwork uric acidGout is managed with chronic therapy. Uric acid levels will be checked as part of routine monitoring. 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 Dietary habits and exercise were discussed, and healthy eating and weight loss strategies were encouraged, including a recipe for cauliflower florets as a healthy side dish. Continued healthy eating and exercise are encouraged. Dental caries Dental cavity, right upper wisdom tooth   A possible dental cavity or large filling was noted on the right upper wisdom tooth. No pain is reported, but a dental evaluation is recommended.    ICD-10-CM   1. Encounter for annual general medical examination with abnormal findings in adult  Z00.01 Lipoprotein Fractionation, NMR with Lipid Panel (Triglycerides/HDL-C)    Lipoprotein A (LPA)    Lipid panel    Comprehensive metabolic panel with GFR    CBC with Differential/Platelet    TSH Rfx on Abnormal to Free T4    Hemoglobin A1c    Uric acid    2. Hair loss  L65.9 finasteride  (PROPECIA ) 1 MG tablet    3. Dyslipidemia  E78.5 Lipoprotein Fractionation, NMR with Lipid Panel (Triglycerides/HDL-C)    Lipoprotein A (LPA)    Lipid panel  4. Hypertriglyceridemia  E78.1 Lipoprotein  Fractionation, NMR with Lipid Panel (Triglycerides/HDL-C)    Lipoprotein A (LPA)    Lipid panel    5. Family history of heart attack  Z82.49 Lipoprotein Fractionation, NMR with Lipid Panel (Triglycerides/HDL-C)    Lipoprotein A (LPA)    Lipid panel    6. Podagra  M10.9 Uric acid    7. Medication management  Z79.899 Lipoprotein Fractionation, NMR with Lipid Panel (Triglycerides/HDL-C)    Lipoprotein A (LPA)    Lipid panel    Comprehensive metabolic panel with GFR    CBC with Differential/Platelet    TSH Rfx on Abnormal to Free T4    Hemoglobin A1c    Uric acid     Reviewed/updated/encouraged completion: Immunization History  Administered Date(s) Administered   Meningococcal Conjugate 02/25/2011   PFIZER(Purple Top)SARS-COV-2 Vaccination 07/29/2019, 08/23/2019   Td (Adult),5 Lf Tetanus Toxid, Preservative Free 06/18/2016   Tdap 02/25/2011   Health Maintenance Due  Topic Date Due   HIV Screening  Never done   Hepatitis C Screening  Never done   Hepatitis B Vaccines 19-59 Average Risk (1 of 3 - 19+ 3-dose series) Never done   HPV VACCINES (1 - 3-dose SCDM series) Never done   Health Maintenance  Topic Date Due   HIV Screening  Never done   Hepatitis C Screening  Never done   Hepatitis B Vaccines 19-59 Average Risk (1 of 3 - 19+ 3-dose series) Never done   HPV VACCINES (1 - 3-dose SCDM series) Never done   COVID-19 Vaccine (4 - 2025-26 season) 03/28/2024 (Originally 01/12/2024)   Influenza Vaccine  05/04/2024 (Originally 12/12/2023)   DTaP/Tdap/Td (3 - Td or Tdap) 06/18/2026   Pneumococcal Vaccine  Aged Out   Meningococcal B Vaccine  Aged Out    Reviewed the following verbally with patient and provided AVS materials:   HEALTH MAINTENANCE COUNSELING AND ANTICIPATORY GUIDANCE    Preventive Measure Recommendation  Eye Exams Every 1-2 years  Dental Care Cleanings every 6 months or more, brush/floss 3x daily  Sinus Care Saline spray rinses daily  Sleep 8 hours nightly, good  sleep hygiene, e-monitoring if any daytime drowsiness  Diet Fruits/vegetables/fiber/healthy fats, balance and moderation  Exercise 150 minutes weekly  Risk Behaviors Discouraged any/all high risk behaviors    CANCER SCREENING SHARED DECISION MAKING    Penile/Testicle/Scrotum Encouraged self-monitoring and reporting of genital abnormalities. Patient reports none.  Thyroid Thyroid was palpated for nodules today  Prostate Individualized risks/benefits/costs discussed Patient is <45-50 and average risk; routine screening not indicated today. Education provided about future SDM timeline. No results found for: PSA  Colon HM Colonoscopy   This patient has no relevant Health Maintenance data.   Patient is <45 without additional risk factors (reports no bleeding or early family history or inflammatory bowel disease); no routine screening indicated today. Counseling provided on symptoms that warrant earlier evaluation. (Education given.)  Lung Current guidelines recommend individuals aged 42 to 98 who currently smoke or formerly smoked and have a >= 20 pack-year smoking history should undergo annual screening with low-dose computed tomography (LDCT). Tobacco Use: Medium Risk (03/12/2024)   Patient History    Smoking Tobacco Use: Former    Smokeless Tobacco Use: Never    Passive Exposure: Not on file   Social History   Tobacco Use  Smoking Status Former   Types: Cigarettes, Cigars  Smokeless Tobacco Never    Skin Advised regular sunscreen use. Patient denies worrisome, changing, or new skin lesions. Offered to  include images in chart for surveillance. Showed patient these pictures of melanomas for reference to educate for self-monitoring.    Discussed the use of AI scribe software for clinical note transcription with the patient, who gave verbal consent to proceed. History of Present Illness  33 year old male who presents for an annual physical exam.  He is currently taking eplerenone for  gout and rosuvastatin  for high cholesterol. He has a strong family history of myocardial infarction and heart problems. His last cholesterol test showed an LDL of 92, which was on a lower dose of statin prescribed by an urgent care doctor before starting rosuvastatin .  He reports chest pain or shortness of breath. 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 He is interested in heart-healthy eating and shares a recipe for a healthy side dish he enjoys.  He recently experienced a dental issue with something in his gums that has since resolved. He is aware of a cavity or filling in his top right wisdom tooth but reports no pain or discomfort.  He is trying to lose weight and is actively working out, planning to go to the gym after the appointment. He mentions that his BMI categorizes him as overweight, but he is focused on improving his diet and exercise routine.  Review of Systems  Constitutional:  Negative for chills, diaphoresis, fever, malaise/fatigue and weight loss.  HENT:  Negative for congestion, ear discharge, ear pain, hearing loss, nosebleeds, sinus pain, sore throat and tinnitus.   Eyes:  Negative for blurred vision, double vision, photophobia, pain, discharge and redness.  Respiratory:  Negative for cough, hemoptysis, sputum production, shortness of breath, wheezing and stridor.   Cardiovascular:  Negative for chest pain, palpitations, orthopnea, claudication, leg swelling and PND.  Gastrointestinal:  Negative for abdominal pain, blood in stool, constipation, diarrhea, heartburn, melena, nausea and vomiting.  Genitourinary:  Negative for dysuria, flank pain, frequency, hematuria and urgency.  Musculoskeletal:  Negative for back pain, falls, joint pain, myalgias and neck pain.  Skin:  Negative for itching and rash.  Neurological:  Positive for headaches (mild gone). Negative for dizziness, tingling, tremors, sensory change,  speech change, focal weakness, seizures, loss of consciousness and weakness.  Endo/Heme/Allergies:  Negative for environmental allergies and polydipsia. Does not bruise/bleed easily.  Psychiatric/Behavioral:  Negative for depression, hallucinations, memory loss, substance abuse and suicidal ideas. The patient is not nervous/anxious and does not have insomnia.    Completed medication reconciliation: Current Outpatient Medications on File Prior to Visit  Medication Sig   allopurinol  (ZYLOPRIM ) 100 MG tablet Take 1 tablet (100 mg total) by mouth daily.   Ascorbic Acid (VITAMIN C ) 1000 MG tablet Take 1 tablet (1,000 mg total) by mouth daily.   Colchicine  0.6 MG CAPS Day 1: Take 2 caps, then 1 cap an hour later. Day 2 and beyond: 1 cap daily   fluticasone  (FLONASE ) 50 MCG/ACT nasal spray Place 2 sprays into both nostrils daily.   loratadine  (CLARITIN ) 10 MG tablet Take 1 tablet (10 mg total) by mouth daily.   Magnesium  500 MG CAPS Take 1 capsule (500 mg total) by mouth in the morning and at bedtime.   omega-3 acid ethyl esters (LOVAZA ) 1 g capsule TAKE 2 CAPSULES BY MOUTH 2 TIMES DAILY.   rosuvastatin  (CRESTOR ) 40 MG tablet TAKE 1 TABLET (40 MG TOTAL) BY MOUTH DAILY. REPLACES ATORVASTATIN   Saline (SIMPLY SALINE) 0.9 % AERS Place 2 each into  the nose as directed. Use nightly for sinus hygiene long-term.  Can also be used as many times daily as desired to assist with clearing congested sinuses.   Turmeric 09-998 MG CAPS Take 1,000 mg by mouth in the morning and at bedtime.   UNABLE TO FIND Take 3,000 mg by mouth daily. Tart Cherry   No current facility-administered medications on file prior to visit.   Medications Discontinued During This Encounter  Medication Reason   pseudoephedrine  (SUDAFED 12 HOUR) 120 MG 12 hr tablet Patient Preference   predniSONE  (DELTASONE ) 20 MG tablet Completed Course   amoxicillin -clavulanate (AUGMENTIN ) 875-125 MG tablet Completed Course   finasteride  (PROPECIA ) 1 MG  tablet Reorder  The following were reviewed and/or entered/updated into our electronic MEDICAL RECORD NUMBERPast Medical History:  Diagnosis Date   Hyperlipidemia   History reviewed. No pertinent surgical history. Social History   Socioeconomic History   Marital status: Single    Spouse name: Not on file   Number of children: Not on file   Years of education: Not on file   Highest education level: Not on file  Occupational History   Not on file  Tobacco Use   Smoking status: Former    Types: Cigarettes, Cigars   Smokeless tobacco: Never  Vaping Use   Vaping status: Never Used  Substance and Sexual Activity   Alcohol use: Yes    Alcohol/week: 6.0 - 9.0 standard drinks of alcohol    Types: 1 - 3 Glasses of wine, 5 - 6 Cans of beer per week   Drug use: Never   Sexual activity: Yes  Other Topics Concern   Not on file  Social History Narrative   Not on file   Social Drivers of Health   Financial Resource Strain: Not on file  Food Insecurity: Not on file  Transportation Needs: Not on file  Physical Activity: Not on file  Stress: Not on file  Social Connections: Not on file  Intimate Partner Violence: Not on file   Family History  Problem Relation Age of Onset   Stroke Mother    Hyperlipidemia Mother    Alcohol abuse Mother    Hypertension Father    Alcohol abuse Father    Hypertension Brother    Hyperlipidemia Brother    Heart attack Brother    Arthritis Paternal Grandmother    Cancer Paternal Grandfather   No Known Allergies Social History   Substance and Sexual Activity  Sexual Activity Yes  @    03/12/2023   10:35 AM  Depression screen PHQ 2/9  Decreased Interest 0  Down, Depressed, Hopeless 0  PHQ - 2 Score 0      03/12/2023   10:34 AM  Fall Risk   Falls in the past year? 0  Number falls in past yr: 0  Injury with Fall? 0  Risk for fall due to : No Fall Risks  Follow up Falls evaluation completed     BP 122/84   Pulse (!) 57   Temp (!) 97.2 F  (36.2 C) (Temporal)   Ht 5' 10 (1.778 m)   Wt 210 lb 12.8 oz (95.6 kg)   SpO2 97%   BMI 30.25 kg/m  BP Readings from Last 3 Encounters:  03/12/24 122/84  02/02/24 130/78  07/29/23 124/82   Wt Readings from Last 10 Encounters:  03/12/24 210 lb 12.8 oz (95.6 kg)  02/02/24 212 lb (96.2 kg)  07/29/23 207 lb 1.9 oz (93.9 kg)  03/12/23 202 lb 12.8  oz (92 kg)  Physical Exam  GEN: No acute distress, resting comfortably. HEENT: Tympanic membranes normal appearing bilaterally, oropharynx clear, no thyromegaly noted, no palpable lymphadenopathy or thyroid nodules. CARDIOVASCULAR: S1 and S2 heart sounds with regular rate and rhythm, no murmurs appreciated. PULMONARY: Normal work of breathing, clear to auscultation bilaterally, no crackles, wheezes, or rhonchi. ABDOMEN: Soft, nontender, nondistended. MSK: No edema, cyanosis, or clubbing noted. SKIN: Warm, dry, no lesions of concern observed. NEUROLOGICAL: Cranial nerves II-XII grossly intact, strength 5/5 in upper and lower extremities, reflexes symmetric and intact bilaterally. PSYCH: Normal affect and thought content, pleasant and cooperative.  Lab Results  Component Value Date   CHOL 149 03/12/2023   HDL 35.40 (L) 03/12/2023   LDLCALC 92 03/12/2023   TRIG 108.0 03/12/2023   CHOLHDL 4 03/12/2023  This was on a lower dose separate statin  ======================================  IMPORTANT HEALTH REMINDERS: Report any new or changing skin lesions promptly Maintain recommended screening schedules Discuss any new family history of cancer at future visits Follow up on any new symptoms that persist more than two weeks      Notes:  This document was synthesized by artificial intelligence ([employer]bridge) using HIPAA-compliant recording of the clinical interaction;   We discussed the use of AI scribe software for clinical note transcription with the patient, who gave verbal consent to proceed.    This encounter employed state-of-the-art,  real-time, collaborative documentation. The patient was empowered to actively review and assist in updating their electronic medical record on a shared monitor, ensuring transparency and improving accuracy.    Prior to and at the beginning of Comprehensive Physical Exam (CPE) preventive care annual visit appointment types  we clarify to patients Our goal today is to focus on your preventive or annual Comprehensive Physical Exam (CPE) preventive care annual visit, which typically covers routine screenings and overall health maintenance. However, if you share any new or concerning symptoms--such as dizziness, passing out, severe pain, or anything else that may point to a more serious issue--we are both legally and ethically required to evaluate it. We cannot simply overlook or ignore such concerns, even if you later decide you don't want to discuss them, because it could jeopardize your health.  If addressing a new concern takes us  beyond the scope of the preventive visit, we may need to bill separately for that portion of care. We understand financial considerations are important, and we're happy to discuss your options if something new comes up. However, we want to be clear that once you mention a potentially serious issue, we must investigate it; we can't ethically or legally exclude that from our records or our evaluation. Please let us  know all of your questions or worries. Together, we can decide how best to manage them and how to minimize any unexpected costs, but we want to keep you safe above all else.   This disclosure is mandated by professional ethics and legal obligations, as healthcare providers must address any substantial health concerns raised during any patient interaction and a comprehensive ROS is required by insurance companies for billing preventive-care visit type.   This disclosure ultimately discourages patients financially from reporting significant health issues.   Medical Screening  Exam A medical screening exam (MSE) helps to determine whether you need immediate medical treatment relating to any number of symptoms you are having. This type of exam may be done in an emergency department, an urgent care setting, or your health care provider's office. Depending on your symptoms and severity, you  may need additional tests or medical therapy. It is important to note that an MSE does not necessarily mean that you will need or receive further medical testing or interventions if your symptoms are not deemed to be medically urgent (emergent). Tell a health care provider about: Any allergies you have. All medicines you are taking, including vitamins, herbs, eye drops, creams, and over-the-counter medicines. Any problems you or family members have had with anesthetic medicines. Any bleeding problems you have. Any surgeries you have had. Any medical conditions you have. Whether you are pregnant or may be pregnant. What happens during the test? During the exam, a health care provider does a short, often focused, physical exam and asks about your medical history to assess: Your current symptoms. Your overall health. Your need for possible further medical intervention. What can I expect after the test? If you have a regular health care provider, make an appointment for a follow-up visit with him or her. If you do not have a regular health care provider, ask about resources in your community. Your medical screening exam may determine that: You do not need emergency treatment at this time. You need treatment right away. You need to be transferred to another medical center. This may happen if you need an emergent specialist or consultant that is not available at the medical center you are at. You need to have more tests. A medical specialist may be consulted if needed. Get help right away if: Your condition gets worse. You develop new or troubling symptoms before you see your health  care provider. These symptoms may represent a serious problem that is an emergency. Do not wait to see if the symptoms will go away. Get medical help right away. Call your local emergency services (911 in the [place] Do not drive yourself to the hospital. Summary A medical screening exam helps to determine whether you need medical treatment right away. This type of exam may be done in an emergency department, an urgent care setting, or your health care provider's office. During the exam, a health care provider does a short physical exam and asks about your current symptoms and overall health. Depending on the exam, more tests or therapies may be ordered. However, an MSE does not necessarily mean that you will have further medical testing if your symptoms are not deemed to be urgent. If you need further care that is not offered at your current medical center, you may need to be transferred to another facility. This information is not intended to replace advice given to you by your health care provider. Make sure you discuss any questions you have with your health care provider. Document Revised: 01/10/2021 Document Reviewed: 09/07/2020 Elsevier Patient Education  2024 [employer] Inc[employer]   Health Maintenance, Male Adopting a healthy lifestyle and getting preventive care are important in promoting health and wellness. Ask your health care provider about: The right schedule for you to have regular tests and exams. Things you can do on your own to prevent diseases and keep yourself healthy. What should I know about diet, weight, and exercise? Eat a healthy diet  Eat a diet that includes plenty of vegetables, fruits, low-fat dairy products, and lean protein. Do not eat a lot of foods that are high in solid fats, added sugars, or sodium. Maintain a healthy weight Body mass index (BMI) is a measurement that can be used to identify possible weight problems. It estimates body fat based on height and weight.  Your health care provider  can help determine your BMI and help you achieve or maintain a healthy weight. Get regular exercise Get regular exercise. This is one of the most important things you can do for your health. Most adults should: Exercise for at least 150 minutes each week. The exercise should increase your heart rate and make you sweat (moderate-intensity exercise). Do strengthening exercises at least twice a week. This is in addition to the moderate-intensity exercise. Spend less time sitting. Even light physical activity can be beneficial. Watch cholesterol and blood lipids Have your blood tested for lipids and cholesterol at 32 years of age, then have this test every 5 years. You may need to have your cholesterol levels checked more often if: Your lipid or cholesterol levels are high. You are older than 32 years of age. You are at high risk for heart disease. What should I know about cancer screening? Many types of cancers can be detected early and may often be prevented. Depending on your health history and family history, you may need to have cancer screening at various ages. This may include screening for: Colorectal cancer. Prostate cancer. Skin cancer. Lung cancer. What should I know about heart disease, diabetes, and high blood pressure? Blood pressure and heart disease High blood pressure causes heart disease and increases the risk of stroke. This is more likely to develop in people who have high blood pressure readings or are overweight. Talk with your health care provider about your target blood pressure readings. Have your blood pressure checked: Every 3-5 years if you are 68-38 years of age. Every year if you are 17 years old or older. If you are between the ages of 64 and 9 and are a current or former smoker, ask your health care provider if you should have a one-time screening for abdominal aortic aneurysm (AAA). Diabetes Have regular diabetes screenings. This  checks your fasting blood sugar level. Have the screening done: Once every three years after age 72 if you are at a normal weight and have a low risk for diabetes. More often and at a younger age if you are overweight or have a high risk for diabetes. What should I know about preventing infection? Hepatitis B If you have a higher risk for hepatitis B, you should be screened for this virus. Talk with your health care provider to find out if you are at risk for hepatitis B infection. Hepatitis C Blood testing is recommended for: Everyone born from [birth year] through 1964. Anyone with known risk factors for hepatitis C. Sexually transmitted infections (STIs) You should be screened each year for STIs, including gonorrhea and chlamydia, if: You are sexually active and are younger than 32 years of age. You are older than 32 years of age and your health care provider tells you that you are at risk for this type of infection. Your sexual activity has changed since you were last screened, and you are at increased risk for chlamydia or gonorrhea. Ask your health care provider if you are at risk. Ask your health care provider about whether you are at high risk for HIV. Your health care provider may recommend a prescription medicine to help prevent HIV infection. If you choose to take medicine to prevent HIV, you should first get tested for HIV. You should then be tested every 3 months for as long as you are taking the medicine. Follow these instructions at home: Alcohol use Do not drink alcohol if your health care provider tells you not to drink. If  you drink alcohol: Limit how much you have to 0-2 drinks a day. Know how much alcohol is in your drink. In the [place] one drink equals one 12 oz bottle of beer (355 mL), one 5 oz glass of wine (148 mL), or one 1 oz glass of hard liquor (44 mL). Lifestyle Do not use any products that contain nicotine or tobacco. These products include cigarettes, chewing tobacco,  and vaping devices, such as e-cigarettes. If you need help quitting, ask your health care provider. Do not use street drugs. Do not share needles. Ask your health care provider for help if you need support or information about quitting drugs. General instructions Schedule regular health, dental, and eye exams. Stay current with your vaccines. Tell your health care provider if: You often feel depressed. You have ever been abused or do not feel safe at home. Summary Adopting a healthy lifestyle and getting preventive care are important in promoting health and wellness. Follow your health care provider's instructions about healthy diet, exercising, and getting tested or screened for diseases. Follow your health care provider's instructions on monitoring your cholesterol and blood pressure. This information is not intended to replace advice given to you by your health care provider. Make sure you discuss any questions you have with your health care provider. Document Revised: 09/18/2020 Document Reviewed: 09/18/2020 Elsevier Patient Education  2024 [employer].

## 2024-03-13 LAB — TSH RFX ON ABNORMAL TO FREE T4: TSH: 1.85 u[IU]/mL (ref 0.450–4.500)

## 2024-03-14 ENCOUNTER — Encounter: Payer: Self-pay | Admitting: Internal Medicine

## 2024-03-14 ENCOUNTER — Ambulatory Visit: Payer: Self-pay | Admitting: Internal Medicine

## 2024-03-14 DIAGNOSIS — D582 Other hemoglobinopathies: Secondary | ICD-10-CM | POA: Insufficient documentation

## 2024-03-14 DIAGNOSIS — R7401 Elevation of levels of liver transaminase levels: Secondary | ICD-10-CM | POA: Insufficient documentation

## 2024-03-15 ENCOUNTER — Other Ambulatory Visit: Payer: Self-pay

## 2024-03-15 DIAGNOSIS — E781 Pure hyperglyceridemia: Secondary | ICD-10-CM

## 2024-03-15 DIAGNOSIS — E785 Hyperlipidemia, unspecified: Secondary | ICD-10-CM

## 2024-03-15 NOTE — Telephone Encounter (Signed)
 read by Franky SHAUNNA Prime at 4:39PM on 03/14/2024

## 2024-03-19 LAB — LIPOPROTEIN FRACTIONATION, NMR W/ LIPID PNL
CHOL/HDL C: 4 calc (ref <5.0)
CHOLESTEROL, TOTAL: 157 mg/dL (ref <200)
HDL CHOLESTEROL: 39 mg/dL — ABNORMAL LOW (ref >39)
HDL P: 36.2 umol/L (ref >32.8)
HDL Size: <8 nm (ref >9.0)
LARGE HDL P: <3 umol/L — ABNORMAL LOW (ref >7.2)
LARGE VLDL P: 3 nmol/L (ref <3.7)
LDL CHOLESTEROL: 90 mg/dL (ref <100)
LDL P: 1774 nmol/L — ABNORMAL HIGH (ref <935)
LDL SIZE: 20.5 nm — ABNORMAL LOW (ref >20.5)
NON HDL CHOLESTEROL: 118 mg/dL (ref <130)
SMALL LDL P: 966 nmol/L — ABNORMAL HIGH (ref <467)
TG/HDL C: 4.7 calc — ABNORMAL HIGH (ref <2.0)
TRIGLYCERIDES: 183 mg/dL — ABNORMAL HIGH (ref <150)
VLDL Size: 48.1 nm — ABNORMAL HIGH (ref <47.1)

## 2024-03-19 LAB — LIPOPROTEIN A (LPA): Lipoprotein (a): 55 nmol/L (ref <75)

## 2024-03-19 NOTE — Progress Notes (Unsigned)
 Office Visit Note  Patient: Devin Jenkins             Date of Birth: 01/22/1991           MRN: [id number]             PCP: Jesus Bernardino MATSU, MD Referring: Jesus Bernardino MATSU, MD Visit Date: 03/31/2024 Occupation: PILOT  Subjective:  History of gout   History of Present Illness: Devin Jenkins is a 33 y.o. male who presents today for a new patient consultation.  Patient reports that he initially started to show symptoms of gout in his mid 61s.  Patient states that at that time he would be evaluated urgent care for acute management of these flares.  Patient states that the formal diagnosis of gout was made about 1 year ago by his PCP.  Patient states that his most recent gout flare was in early September 2025.  Patient states that most of his gout flares have affected the right great toe but have occasionally gone into the right ankle.  He denies any other joint involvement.  He has had some increased discomfort in his knees which he attributes to his activity level with previous sports.  Patient states that he was started on allopurinol  100 mg daily a few weeks ago.  He takes colchicine  on an as needed basis for gout flares.  He has tolerated colchicine  without any side effects.  He previously had GI side effects while taking indomethacin.  He has not taken prednisone  or had a cortisone injection in the past.  Patient states that gout flares have been induced by shellfish in the past.  He rarely eats shellfish or red meat.  He occasionally will drink beer or wine.  Patient denies any tophi.   He denies any known family history of gout.    Activities of Daily Living:  Patient reports morning stiffness for 0 minute.   Patient Denies nocturnal pain.  Difficulty dressing/grooming: Denies Difficulty climbing stairs: Denies Difficulty getting out of chair: Denies Difficulty using hands for taps, buttons, cutlery, and/or writing: Denies  Review of Systems  Constitutional:  Negative for fatigue.   HENT:  Negative for mouth sores and mouth dryness.   Eyes:  Negative for dryness.  Respiratory:  Negative for shortness of breath.   Cardiovascular:  Negative for chest pain and palpitations.  Gastrointestinal:  Negative for blood in stool, constipation and diarrhea.  Endocrine: Negative for increased urination.  Genitourinary:  Negative for involuntary urination.  Musculoskeletal:  Positive for joint pain, joint pain, myalgias and myalgias. Negative for gait problem, joint swelling, muscle weakness, morning stiffness and muscle tenderness.  Skin:  Positive for sensitivity to sunlight. Negative for color change, rash and hair loss.  Allergic/Immunologic: Positive for susceptible to infections.  Neurological:  Negative for dizziness and headaches.  Hematological:  Negative for swollen glands.  Psychiatric/Behavioral:  Negative for depressed mood and sleep disturbance. The patient is not nervous/anxious.     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 No pertinent surgical history. Social History   Tobacco Use   Smoking status: Former    Types: Cigarettes, Cigars    Smokeless tobacco: Never  Vaping Use   Vaping status: Never Used  Substance Use Topics   Alcohol use: Yes    Alcohol/week: 6.0 - 9.0 standard drinks of alcohol    Types: 1 - 3 Glasses of wine, 5 - 6 Cans of beer per week   Drug use: Never   Social History   Social History Narrative   Not on file     Immunization History  Administered Date(s) Administered   Meningococcal Conjugate 02/25/2011   PFIZER(Purple Top)SARS-COV-2 Vaccination 07/29/2019, 08/23/2019   Td (Adult),5 Lf Tetanus Toxid, Preservative Free 06/18/2016   Tdap 02/25/2011     Objective: Vital Signs: BP 135/83 (BP Location: Right Arm, Patient Position: Sitting, Cuff Size: Normal)   Pulse 62   Temp 98.1 F (36.7 C)   Resp 14   Ht 5' 9.75 (1.772 m)   Wt 212 lb 12.8 oz (96.5 kg)   BMI 30.75 kg/m    Physical Exam Vitals and nursing note reviewed.  Constitutional:      Appearance: He is well-developed.  HENT:     Head: Normocephalic and atraumatic.  Eyes:     Conjunctiva/sclera: Conjunctivae normal.     Pupils: Pupils are equal, round, and reactive to light.  Cardiovascular:     Rate and Rhythm: Normal rate and regular rhythm.     Heart sounds: Normal heart sounds.  Pulmonary:     Effort: Pulmonary effort is normal.     Breath sounds: Normal breath sounds.  Abdominal:     General: Bowel sounds are normal.     Palpations: Abdomen is soft.  Musculoskeletal:     Cervical back: Normal range of motion and neck supple.  Skin:    General: Skin is warm and dry.     Capillary Refill: Capillary refill takes less than 2 seconds.  Neurological:     Mental Status: He is alert and oriented to person, place, and time.  Psychiatric:        Behavior: Behavior normal.      Musculoskeletal Exam: C-spine, thoracic spine, lumbar spine have good range of motion.  No midline spinal tenderness.  No SI joint tenderness.  Shoulder joints, elbow joints, wrist joints, MCPs, PIPs, DIPs have good range of motion with no  synovitis.  Complete fist formation bilaterally.  Hip joints have good range of motion with no groin pain.  Knee joints have good range of motion no warmth or effusion.  Ankle joints have good range of motion no tenderness or joint swelling.  No evidence of Achilles tendinitis or plantar fasciitis. Mild prominence and tenderness of right 1st MTP joint.  CDAI Exam: CDAI Score: -- Patient Global: --; Provider Global: -- Swollen: --; Tender: -- Joint Exam 03/31/2024   No joint exam has been documented for this visit   There is currently no information documented on the homunculus. Go to the Rheumatology activity and complete the homunculus joint exam.  Investigation:  No additional findings.  Imaging: XR Foot 2 Views Left Result Date: 03/31/2024 No MTP, PIP or DIP narrowing was noted.  No intertarsal, tibiotalar or subtalar joint space narrowing was noted.  No erosive changes were noted. Impression: Unremarkable x-rays of the foot.  XR Foot 2 Views Right Result Date: 03/31/2024 No MTP, PIP or DIP narrowing was noted.  No intertarsal, tibiotalar or subtalar joint space narrowing was noted.  No erosive changes were noted. Impression: Unremarkable x-rays of the foot.   Recent Labs: Lab Results  Component Value Date   WBC 4.9 03/12/2024   HGB 17.1 (H) 03/12/2024   PLT 153.0 03/12/2024   NA 139 03/12/2024   K 4.0 03/12/2024   CL 103 03/12/2024   CO2 26 03/12/2024   GLUCOSE 79 03/12/2024   BUN 18 03/12/2024   CREATININE 1.13 03/12/2024   BILITOT 1.9 (H) 03/12/2024   ALKPHOS 36 (L) 03/12/2024   AST 37 03/12/2024   ALT 55 (H) 03/12/2024   PROT 7.4 03/12/2024   ALBUMIN 5.2 03/12/2024   CALCIUM  9.8 03/12/2024    Speciality Comments: No specialty comments available.  Procedures:  No procedures performed Allergies: Patient has no known allergies.   Assessment / Plan:     Visit Diagnoses: Idiopathic chronic gout of multiple sites without tophus -Symptom onset mid-20s-formally  diagnosed about 1 year ago: Patient initially started to experience pain and inflammation affecting the right great toe in his mid 5s but was not formally diagnosed with gout until about 1 year ago.  He he has no known family history of gout.  He has had recurrent gout flares affecting the right great toe and occasionally in the right ankle-no other joints have been involved.  Previous gout flares have been triggered by shellfish and alcohol use.  His most recent gout flare affected the right great toe in September 2025.  He has been initiated on allopurinol  100 mg daily starting a few weeks ago.  He has been tolerating allopurinol  without any side effects.  His uric acid level was 8.9 on 03/12/2024.  Plan to recheck uric acid level today.  The current plan is to increase allopurinol  to 200 mg daily for 1 week and if labs are stable at that time he will increase to 300 mg daily.  He has a prescription for colchicine  which he takes as needed during flares.  Discussed that he may want to take colchicine  on a daily basis while increasing the dose of allopurinol  to prevent a flare.  He voiced understanding.  Plan: Uric acid, C-reactive protein, Sedimentation rate  Medication monitoring encounter -Allopurinol  100 mg 1 tablet daily.  Plan to increase to 200 mg daily x 1 week and if labs are stable he will increase to 300 mg daily.  He will take colchicine  1 capsule daily while increasing the dose of allopurinol  and then will take colchicine  on an as needed basis.   Previous intolerance to indomethacin.   Has not taken prednisone  in the past.  Plan to check CBC, CMP, and uric acid level today.  Plan: CBC with Differential/Platelet, Comprehensive metabolic panel with GFR, Uric acid  Pain in both feet -Mild prominence of the right 1st MTP joint noted.  No synovitis noted today.  X-rays of both feet obtained today for further evaluation.  Plan: XR Foot 2 Views Right, XR Foot 2 Views Left  Other medical conditions  are listed as follows:   Dysfunction of both eustachian tubes  Transaminitis: Plan to recheck CMP today.  Former smoker  Hypertriglyceridemia  Family history of heart attack    Orders: Orders Placed This Encounter  Procedures   XR Foot 2 Views Right   XR Foot 2 Views Left   CBC with Differential/Platelet   Comprehensive metabolic panel with GFR   Uric acid   C-reactive protein   Sedimentation rate   No orders of the defined types were placed in this encounter.    Follow-Up Instructions: Return in about 6 weeks (around 05/12/2024) for NPFU-gout.   Waddell CHRISTELLA Craze, PA-C  Note - This record has been created using Dragon software.  Chart creation errors have been sought, but may not always  have been located. Such creation errors do not reflect on  the standard of medical care.

## 2024-03-31 ENCOUNTER — Encounter: Payer: Self-pay | Admitting: Physician Assistant

## 2024-03-31 ENCOUNTER — Ambulatory Visit: Attending: Physician Assistant | Admitting: Physician Assistant

## 2024-03-31 ENCOUNTER — Ambulatory Visit

## 2024-03-31 VITALS — BP 135/83 | HR 62 | Temp 98.1°F | Resp 14 | Ht 69.75 in | Wt 212.8 lb

## 2024-03-31 DIAGNOSIS — E781 Pure hyperglyceridemia: Secondary | ICD-10-CM

## 2024-03-31 DIAGNOSIS — M79671 Pain in right foot: Secondary | ICD-10-CM

## 2024-03-31 DIAGNOSIS — Z87891 Personal history of nicotine dependence: Secondary | ICD-10-CM

## 2024-03-31 DIAGNOSIS — M79672 Pain in left foot: Secondary | ICD-10-CM

## 2024-03-31 DIAGNOSIS — E785 Hyperlipidemia, unspecified: Secondary | ICD-10-CM

## 2024-03-31 DIAGNOSIS — H6993 Unspecified Eustachian tube disorder, bilateral: Secondary | ICD-10-CM

## 2024-03-31 DIAGNOSIS — M1A09X0 Idiopathic chronic gout, multiple sites, without tophus (tophi): Secondary | ICD-10-CM | POA: Diagnosis not present

## 2024-03-31 DIAGNOSIS — Z8249 Family history of ischemic heart disease and other diseases of the circulatory system: Secondary | ICD-10-CM

## 2024-03-31 DIAGNOSIS — M109 Gout, unspecified: Secondary | ICD-10-CM | POA: Diagnosis not present

## 2024-03-31 DIAGNOSIS — R7401 Elevation of levels of liver transaminase levels: Secondary | ICD-10-CM | POA: Diagnosis not present

## 2024-03-31 DIAGNOSIS — Z5181 Encounter for therapeutic drug level monitoring: Secondary | ICD-10-CM

## 2024-03-31 DIAGNOSIS — Z720 Tobacco use: Secondary | ICD-10-CM

## 2024-03-31 DIAGNOSIS — L659 Nonscarring hair loss, unspecified: Secondary | ICD-10-CM

## 2024-03-31 NOTE — Patient Instructions (Addendum)
 Take allopurinol  200 mg daily x1 week and if labs are stable---increase to 300 mg daily.      Information for patients with Gout  Gout defined-Gout occurs when urate crystals accumulate in your joint causing the inflammation and intense pain of gout attack.  Urate crystals can form when you have high levels of uric acid in your blood.  Your body produces uric acid when it breaks down prurines-substances that are found naturally in your body, as well as in certain foods such as organ meats, anchioves, herring, asparagus, and mushrooms.  Normally uric acid dissolves in your blood and passes through your kidneys into your urine.  But sometimes your body either produces too much uric acid or your kidneys excrete too little uric acid.  When this happens, uric acid can build up, forming sharp needle-like urate crystals in a joint or surrounding tissue that cause pain, inflammation and swelling.    Gout is characterized by sudden, severe attacks of pain, redness and tenderness in joints, often the joint at the base of the big toe.  Gout is complex form of arthritis that can affect anyone.  Men are more likely to get gout but women become increasingly more susceptible to gout after menopause.  An acute attack of gout can wake you up in the middle of the night with the sensation that your big toe is on fire.  The affected joint is hot, swollen and so tender that even the weight or the sheet on it may seem intolerable.  If you experience symptoms of an acute gout attack it is important to your doctor as soon as the symptoms start.  Gout that goes untreated can lead to worsening pain and joint damage.  Risk Factors:  You are more likely to develop gout if you have high levels of uric acid in your body.    Factors that increase the uric acid level in your body include:  Lifestyle factors.  Excessive alcohol use-generally more than two drinks a day for men and more than one for women increase the risk of  gout.  Medical conditions.  Certain conditions make it more likely that you will develop gout.  These include hypertension, and chronic conditions such as diabetes, high levels of fat and cholesterol in the blood, and narrowing of the arteries.  Certain medications.  The uses of Thiazide diuretics- commonly used to treat hypertension and low dose aspirin can also increase uric acid levels.  Family history of gout.  If other members of your family have had gout, you are more likely to develop the disease.  Age and sex. Gout occurs more often in men than it does in women, primarily because women tend to have lower uric acid levels than men do.  Men are more likely to develop gout earlier usually between the ages of 54-50- whereas women generally develop signs and symptoms after menopause.    Tests and diagnosis:  Tests to help diagnose gout may include:  Blood test.  Your doctor may recommend a blood test to measure the uric acid level in your blood .  Blood tests can be misleading, though.  Some people have high uric acid levels but never experience gout.  And some people have signs and symptoms of gout, but don't have unusual levels of uric acid in their blood.  Joint fluid test.  Your doctor may use a needle to draw fluid from your affected joint.  When examined under the microscope, your joint fluid may reveal  urate crystals.  Treatment:  Treatment for gout usually involves medications.  What medications you and your doctor choose will be based on your current health and other medications you currently take.  Gout medications can be used to treat acute gout attacks and prevent future attacks as well as reduce your risk of complications from gout such as the development of tophi from urate crystal deposits.  Alternative medicine:   Certain foods have been studied for their potential to lower uric acid levels, including:  Coffee.  Studies have found an association between coffee drinking  (regular and decaf) and lower uric acid levels.  The evidence is not enough to encourage non-coffee drinkers to start, but it may give clues to new ways of treating gout in the future.  Vitamin C .  Supplements containing vitamin C  may reduce the levels of uric acid in your blood.  However, vitamin as a treatment for gout. Don't assume that if a little vitamin C  is good, than lots is better.  Megadoses of vitamin C  may increase your bodies uric acid levels.  Cherries.  Cherries have been associated with lower levels of uric acid in studies, but it isn't clear if they have any effect on gout signs and symptoms.  Eating more cherries and other dar-colored fruits, such as blackberries, blueberries, purple grapes and raspberries, may be a safe way to support your gout treatment.    Lifestyle/Diet Recommendations:  Drink 8 to 16 cups ( about 2 to 4 liters) of fluid each day, with at least half being water. Avoid alcohol Eat a moderate amount of protein, preferably from healthy sources, such as low-fat or fat-free dairy, tofu, eggs, and nut butters. Limit you daily intake of meat, fish, and poultry to 4 to 6 ounces. Avoid high fat meats and desserts. Decrease you intake of shellfish, beef, lamb, pork, eggs and cheese. Choose a good source of vitamin C  daily such as citrus fruits, strawberries, broccoli,  brussel sprouts, papaya, and cantaloupe.  Choose a good source of vitamin A every other day such as yellow fruits, or dark green/yellow vegetables. Avoid drastic weigh reduction or fasting.  If weigh loss is desired lose it over a period of several months. See dietary considerations.. chart for specific food recommendations.  Dietary Considerations for people with Gout  Food with negligible purine content (0-15 mg of purine nitrogen per 100 grams food)  May use as desired except on calorie variations  Non fat milk Cocoa Cereals (except in list II) Hard candies  Buttermilk Carbonated drinks  Vegetables (except in list II) Sherbet  Coffee Fruits Sugar Honey  Tea Cottage Cheese Gelatin-jell-o Salt  Fruit juice Breads Angel food Cake   Herbs/spices Jams/Jellies [employer]    Foods that do not contain excessive purine content, but must be limited due to fat content  Cream Eggs Oil and Salad Dressing  Half and Half Peanut Butter Chocolate  Whole Milk Cakes Potato Chips  Butter Ice Cream Fried Foods  Cheese Nuts Waffles, pancakes   List II: Food with moderate purine content (50-150 mg of purine nitrogen per 100 grams of food)  Limit total amount each day to 5 oz. cooked Lean meat, other than those on list III   Poultry, other than those on list III Fish, other than those on list III   Seafood, other than those on list III  These foods may be used occasionally  Peas Lentils Bran  Spinach Oatmeal Dried Beans and Peas  Asparagus Wheat Germ  Mushrooms   Additional information about meat choices  Choose fish and poultry, particularly without skin, often.  Select lean, well trimmed cuts of meat.  Avoid all fatty meats, bacon , sausage, fried meats, fried fish, or poultry, luncheon meats, cold cuts, hot dogs, meats canned or frozen in gravy, spareribs and frozen and packaged prepared meats.   List III: Foods with HIGH purine content / Foods to AVOID (150-800 mg of purine nitrogen per 100 grams of food)  Anchovies Herring Meat Broths  Liver Mackerel Meat Extracts  Kidney Scallops Meat Drippings  Sardines Wild Game Mincemeat  Sweetbreads Goose Gravy  Heart Tongue Yeast, baker's and brewers   Commercial soups made with any of the foods listed in List II or List III  In addition avoid all alcoholic beverages     Standing Labs We placed an order today for your standing lab work.   Please have your standing labs drawn in 1 week   Please have your labs drawn 2 weeks prior to your appointment so that the provider can discuss your lab results at your  appointment, if possible.  Please note that you may see your imaging and lab results in MyChart before we have reviewed them. We will contact you once all results are reviewed. Please allow our office up to 72 hours to thoroughly review all of the results before contacting the office for clarification of your results.  WALK-IN LAB HOURS  Monday through Thursday from 8:00 am - 4:30 pm and Friday from 8:00 am-12:00 pm.  Patients with office visits requiring labs will be seen before walk-in labs.  You may encounter longer than normal wait times. Please allow additional time. Wait times may be shorter on  Monday and Thursday afternoons.  We do not book appointments for walk-in labs. We appreciate your patience and understanding with our staff.   Labs are drawn by [facility]. Please bring your co-pay at the time of your lab draw.  You may receive a bill from [facility] for your lab work.  Please note if you are on Hydroxychloroquine and and an order has been placed for a Hydroxychloroquine level,  you will need to have it drawn 4 hours or more after your last dose.  If you wish to have your labs drawn at another location, please call the office 24 hours in advance so we can fax the orders.  The office is located at [address], [address], [place], [place] [address]   If you have any questions regarding directions or hours of operation,  please call [phone].   As a reminder, please drink plenty of water prior to coming for your lab work. Thanks!

## 2024-03-31 NOTE — Progress Notes (Signed)
 Pharmacy Note   Subjective:  Patient presents today to the [facility] for follow up office visit.  Patient was prescribed allopurinol  and colchicine  for gout. Patient was seen by the pharmacist for counseling on allopurinol  and colchicine .   Objective: CBC    Component Value Date/Time   WBC 4.9 03/12/2024 0932   RBC 5.49 03/12/2024 0932   HGB 17.1 (H) 03/12/2024 0932   HCT 49.5 03/12/2024 0932   PLT 153.0 03/12/2024 0932   MCV 90.2 03/12/2024 0932   MCHC 34.6 03/12/2024 0932   RDW 13.5 03/12/2024 0932   LYMPHSABS 1.9 03/12/2024 0932   MONOABS 0.4 03/12/2024 0932   EOSABS 0.2 03/12/2024 0932   BASOSABS 0.0 03/12/2024 0932    CMP     Component Value Date/Time   NA 139 03/12/2024 0932   K 4.0 03/12/2024 0932   CL 103 03/12/2024 0932   CO2 26 03/12/2024 0932   GLUCOSE 79 03/12/2024 0932   BUN 18 03/12/2024 0932   CREATININE 1.13 03/12/2024 0932   CALCIUM  9.8 03/12/2024 0932   PROT 7.4 03/12/2024 0932   ALBUMIN 5.2 03/12/2024 0932   AST 37 03/12/2024 0932   ALT 55 (H) 03/12/2024 0932   ALKPHOS 36 (L) 03/12/2024 0932   BILITOT 1.9 (H) 03/12/2024 0932    Uric Acid Lab Results  Component Value Date   LABURIC 8.9 (H) 03/12/2024      Assessment/Plan:   Counseled patient on the purpose, proper use, and adverse effects of ALLOPURINOL .  Discussed possible side effects of nausea, diarrhea which are dose-related. Discussed possibility for gout flare when treatment is initiated.  Reviewed possibility for drug-induced hypersensitivity reaction. Patient should discontinue drug and contact office if any symptoms of facial swelling, rash, hives, or dermatologic reaction. Counseled patient on the purpose, proper use, and adverse effects of COLCHICINE .  Discussed possible side effects of nausea, diarrhea which are dose-related. Discussed possible for muscle weakness after long period of use.  Discussed importance of low purine diet - avoiding alcohol, shellfish, red meats,  etc.  Provided patient with medication education material and answered all questions.   Reviewed drug-drug interaction with rosuvastatin  and rosuvastatin  which can increase risk of rhabdomyolysis. Reviewed importance of avoiding alcohol.  Patient's colchicine  dose will be: 0.6 mg once daily. Patient's dose will be 200 mg for 1 week, then increase to 300 mg daily pending labs. Will recheck CMP, CBC, and uric acid in 1 week per dose increase.  Jenkins Graces, PharmD PGY1 Pharmacy Resident [phone]  Sherry Pennant, PharmD, MPH, BCPS, CPP Clinical Pharmacist [facility])

## 2024-04-01 ENCOUNTER — Ambulatory Visit: Payer: Self-pay | Admitting: Physician Assistant

## 2024-04-01 DIAGNOSIS — Z5181 Encounter for therapeutic drug level monitoring: Secondary | ICD-10-CM

## 2024-04-01 DIAGNOSIS — M1A09X0 Idiopathic chronic gout, multiple sites, without tophus (tophi): Secondary | ICD-10-CM

## 2024-04-01 LAB — CBC WITH DIFFERENTIAL/PLATELET
Absolute Lymphocytes: 1958 {cells}/uL (ref 850–3900)
Absolute Monocytes: 413 {cells}/uL (ref 200–950)
Basophils Absolute: 39 {cells}/uL (ref 0–200)
Basophils Relative: 0.7 %
Eosinophils Absolute: 149 {cells}/uL (ref 15–500)
Eosinophils Relative: 2.7 %
HCT: 49.2 % (ref 38.5–50.0)
Hemoglobin: 17.3 g/dL — ABNORMAL HIGH (ref 13.2–17.1)
MCH: 31.3 pg (ref 27.0–33.0)
MCHC: 35.2 g/dL (ref 32.0–36.0)
MCV: 89.1 fL (ref 80.0–100.0)
MPV: 9.6 fL (ref 7.5–12.5)
Monocytes Relative: 7.5 %
Neutro Abs: 2943 {cells}/uL (ref 1500–7800)
Neutrophils Relative %: 53.5 %
Platelets: 176 Thousand/uL (ref 140–400)
RBC: 5.52 Million/uL (ref 4.20–5.80)
RDW: 13 % (ref 11.0–15.0)
Total Lymphocyte: 35.6 %
WBC: 5.5 Thousand/uL (ref 3.8–10.8)

## 2024-04-01 LAB — COMPREHENSIVE METABOLIC PANEL WITH GFR
AG Ratio: 2.7 (calc) — ABNORMAL HIGH (ref 1.0–2.5)
ALT: 71 U/L — ABNORMAL HIGH (ref 9–46)
AST: 41 U/L — ABNORMAL HIGH (ref 10–40)
Albumin: 5.4 g/dL — ABNORMAL HIGH (ref 3.6–5.1)
Alkaline phosphatase (APISO): 48 U/L (ref 36–130)
BUN: 11 mg/dL (ref 7–25)
CO2: 30 mmol/L (ref 20–32)
Calcium: 10.3 mg/dL (ref 8.6–10.3)
Chloride: 104 mmol/L (ref 98–110)
Creat: 0.94 mg/dL (ref 0.60–1.26)
Globulin: 2 g/dL (ref 1.9–3.7)
Glucose, Bld: 79 mg/dL (ref 65–99)
Potassium: 4.8 mmol/L (ref 3.5–5.3)
Sodium: 141 mmol/L (ref 135–146)
Total Bilirubin: 1.2 mg/dL (ref 0.2–1.2)
Total Protein: 7.4 g/dL (ref 6.1–8.1)
eGFR: 110 mL/min/1.73m2

## 2024-04-01 LAB — URIC ACID: Uric Acid, Serum: 5.6 mg/dL (ref 4.0–8.0)

## 2024-04-01 LAB — C-REACTIVE PROTEIN: CRP: <3 mg/L

## 2024-04-01 LAB — SEDIMENTATION RATE: Sed Rate: 2 mm/h (ref 0–15)

## 2024-04-01 MED ORDER — COLCHICINE 0.6 MG PO TABS
0.6000 mg | ORAL_TABLET | Freq: Every day | ORAL | 2 refills | Status: AC | PRN
Start: 2024-04-01 — End: ?

## 2024-04-01 MED ORDER — ALLOPURINOL 100 MG PO TABS
200.0000 mg | ORAL_TABLET | Freq: Every day | ORAL | 2 refills | Status: AC
Start: 2024-04-01 — End: ?

## 2024-04-01 NOTE — Progress Notes (Signed)
 CRP and ESR WNL Hemoglobin is slightly elevated but has trended up. Rest of CBC WNL-we will continue to monitor closely.  Albumin is elevated. AST and ALT are elevated-increased since last checked on 03/12/24-recommend avoiding the use of alcohol and tylenol. We will recheck in 2 weeks.  Uric acid-5.6-improved since starting allopurinol .  Still recommend to increase allopurinol  to 200 mg daily--recheck uric acid and CMP in 2 weeks.   We will hold off on increasing allopurinol  to 300 mg pending labs.   Please clarify if he needs a refill of allopurinol   Please pend a refill of colchicine --1 capsule daily as needed during flares-patient is aware that he can take it daily as he is increasing the dose of allopurinol .

## 2024-05-03 NOTE — Progress Notes (Deleted)
 "  Office Visit Note  Patient: Devin Jenkins             Date of Birth: March 19, 1991           MRN: [id number]             PCP: Jesus Bernardino MATSU, MD Referring: Jesus Bernardino MATSU, MD Visit Date: 05/17/2024 Occupation: PILOT  Subjective:    History of Present Illness: Devin Jenkins is a 33 y.o. male with history of gout.   He is currently taking allopurinol  200 mg daily and colchicine  0.6 mg 1 tablet by mouth daily as needed.    Uric acid 5.6 on 03/31/24.    Activities of Daily Living:  Patient reports morning stiffness for *** {minute/hour:19697}.   Patient {ACTIONS;DENIES/REPORTS:21021675::Denies} nocturnal pain.  Difficulty dressing/grooming: {ACTIONS;DENIES/REPORTS:21021675::Denies} Difficulty climbing stairs: {ACTIONS;DENIES/REPORTS:21021675::Denies} Difficulty getting out of chair: {ACTIONS;DENIES/REPORTS:21021675::Denies} Difficulty using hands for taps, buttons, cutlery, and/or writing: {ACTIONS;DENIES/REPORTS:21021675::Denies}  No Rheumatology ROS completed.   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 Social History[1] Social  History   Social History Narrative   Not on file     Immunization History  Administered Date(s) Administered   Meningococcal Conjugate 02/25/2011   PFIZER(Purple Top)SARS-COV-2 Vaccination 07/29/2019, 08/23/2019   Td (Adult),5 Lf Tetanus Toxid, Preservative Free 06/18/2016   Tdap 02/25/2011     Objective: Vital Signs: There were no vitals taken for this visit.   Physical Exam Vitals and nursing note reviewed.  Constitutional:      Appearance: He is well-developed.  HENT:     Head: Normocephalic and atraumatic.  Eyes:     Conjunctiva/sclera: Conjunctivae normal.     Pupils: Pupils are equal, round, and reactive to light.  Cardiovascular:     Rate and Rhythm: Normal rate and regular rhythm.     Heart sounds: Normal heart sounds.  Pulmonary:     Effort: Pulmonary effort is normal.     Breath sounds: Normal breath sounds.  Abdominal:     General: Bowel sounds are normal.     Palpations: Abdomen is soft.  Musculoskeletal:     Cervical back: Normal range of motion and neck supple.  Skin:    General: Skin is warm and dry.     Capillary Refill: Capillary refill takes less than 2 seconds.  Neurological:     Mental Status: He is alert and oriented to person, place, and time.  Psychiatric:        Behavior: Behavior normal.      Musculoskeletal Exam: ***  CDAI Exam: CDAI Score: -- Patient Global: --; Provider  Global: -- Swollen: --; Tender: -- Joint Exam 05/17/2024   No joint exam has been documented for this visit   There is currently no information documented on the homunculus. Go to the Rheumatology activity and complete the homunculus joint exam.  Investigation: No additional findings.  Imaging: No results found.  Recent Labs: Lab Results  Component Value Date   WBC 5.5 03/31/2024   HGB 17.3 (H) 03/31/2024   PLT 176 03/31/2024   NA 141 03/31/2024   K 4.8 03/31/2024   CL 104 03/31/2024   CO2 30 03/31/2024   GLUCOSE 79 03/31/2024   BUN 11 03/31/2024    CREATININE 0.94 03/31/2024   BILITOT 1.2 03/31/2024   ALKPHOS 36 (L) 03/12/2024   AST 41 (H) 03/31/2024   ALT 71 (H) 03/31/2024   PROT 7.4 03/31/2024   ALBUMIN 5.2 03/12/2024   CALCIUM  10.3 03/31/2024    Speciality Comments: No specialty comments available.  Procedures:  No procedures performed Allergies: Patient has no known allergies.   Assessment / Plan:     Visit Diagnoses: Idiopathic chronic gout of multiple sites without tophus  Medication monitoring encounter  Podagra  Dysfunction of both eustachian tubes  Transaminitis  Hypertriglyceridemia  Family history of heart attack  Former smoker  Orders: No orders of the defined types were placed in this encounter.  No orders of the defined types were placed in this encounter.   Face-to-face time spent with patient was *** minutes. Greater than 50% of time was spent in counseling and coordination of care.  Follow-Up Instructions: No follow-ups on file.   Waddell CHRISTELLA Craze, PA-C  Note - This record has been created using Dragon software.  Chart creation errors have been sought, but may not always  have been located. Such creation errors do not reflect on  the standard of medical care.     [1]  Social History Tobacco Use   Smoking status: Former    Types: Cigarettes, Cigars   Smokeless tobacco: Never  Vaping Use   Vaping status: Never Used  Substance Use Topics   Alcohol use: Yes    Alcohol/week: 6.0 - 9.0 standard drinks of alcohol    Types: 1 - 3 Glasses of wine, 5 - 6 Cans of beer per week   Drug use: Never   "

## 2024-05-17 ENCOUNTER — Ambulatory Visit: Admitting: Physician Assistant

## 2024-05-17 DIAGNOSIS — E781 Pure hyperglyceridemia: Secondary | ICD-10-CM

## 2024-05-17 DIAGNOSIS — Z5181 Encounter for therapeutic drug level monitoring: Secondary | ICD-10-CM

## 2024-05-17 DIAGNOSIS — Z87891 Personal history of nicotine dependence: Secondary | ICD-10-CM

## 2024-05-17 DIAGNOSIS — R7401 Elevation of levels of liver transaminase levels: Secondary | ICD-10-CM

## 2024-05-17 DIAGNOSIS — M109 Gout, unspecified: Secondary | ICD-10-CM

## 2024-05-17 DIAGNOSIS — H6993 Unspecified Eustachian tube disorder, bilateral: Secondary | ICD-10-CM

## 2024-05-17 DIAGNOSIS — Z8249 Family history of ischemic heart disease and other diseases of the circulatory system: Secondary | ICD-10-CM

## 2024-05-17 DIAGNOSIS — M1A09X0 Idiopathic chronic gout, multiple sites, without tophus (tophi): Secondary | ICD-10-CM

## 2024-05-17 NOTE — Progress Notes (Signed)
 "  Office Visit Note  Patient: Devin Jenkins             Date of Birth: 1990-10-26           MRN: [id number]             PCP: Jesus Bernardino MATSU, MD Referring: Jesus Bernardino MATSU, MD Visit Date: 05/18/2024 Occupation: PILOT  Subjective:  Medication monitoring  History of Present Illness: Devin Jenkins is a 34 y.o. male with history of gout.   He is currently taking allopurinol  200 mg daily and colchicine  0.6 mg 1 tablet by mouth daily as needed.  He is tolerating allopurinol  without any side effects and has not had any gaps in therapy.  He has occasional twinges of pain in the right great toe for which she is taking colchicine  on an as needed basis to resolve the symptoms.  He has not had any major gout flares since the last visit.   Activities of Daily Living:  Patient reports morning stiffness for 0 minute.   Patient Denies nocturnal pain.  Difficulty dressing/grooming: Denies Difficulty climbing stairs: Denies Difficulty getting out of chair: Denies Difficulty using hands for taps, buttons, cutlery, and/or writing: Denies  Review of Systems  Constitutional:  Negative for fatigue.  HENT:  Negative for mouth sores and mouth dryness.   Eyes:  Negative for dryness.  Respiratory:  Negative for shortness of breath.   Cardiovascular:  Negative for chest pain and palpitations.  Gastrointestinal:  Negative for blood in stool, constipation and diarrhea.  Endocrine: Negative for increased urination.  Genitourinary:  Negative for involuntary urination.  Musculoskeletal:  Negative for joint pain, gait problem, joint pain, joint swelling, myalgias, muscle weakness, morning stiffness, muscle tenderness and myalgias.  Skin:  Positive for sensitivity to sunlight. Negative for color change, rash and hair loss.  Allergic/Immunologic: Negative for susceptible to infections.  Neurological:  Negative for dizziness and headaches.  Hematological:  Negative for swollen glands.  Psychiatric/Behavioral:   Positive for sleep disturbance. Negative for depressed mood. The patient is not nervous/anxious.     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 No pertinent surgical history. Social History[1] Social History   Social History Narrative   Not on file     Immunization History  Administered Date(s) Administered   Meningococcal Conjugate 02/25/2011   PFIZER(Purple Top)SARS-COV-2 Vaccination 07/29/2019, 08/23/2019   Td (Adult),5 Lf Tetanus Toxid, Preservative Free 06/18/2016   Tdap 02/25/2011     Objective: Vital Signs: BP 131/81   Pulse 67   Temp 98 F (36.7 C)   Resp 14  Ht 5' 10 (1.778 m)   Wt 212 lb (96.2 kg)   BMI 30.42 kg/m    Physical Exam Vitals and nursing note reviewed.  Constitutional:      Appearance: He is well-developed.  HENT:     Head: Normocephalic and atraumatic.  Eyes:     Conjunctiva/sclera: Conjunctivae normal.     Pupils: Pupils are equal, round, and reactive to light.  Cardiovascular:     Rate and Rhythm: Normal rate and regular rhythm.     Heart sounds: Normal heart sounds.  Pulmonary:     Effort: Pulmonary effort is normal.      Breath sounds: Normal breath sounds.  Abdominal:     General: Bowel sounds are normal.     Palpations: Abdomen is soft.  Musculoskeletal:     Cervical back: Normal range of motion and neck supple.  Skin:    General: Skin is warm and dry.     Capillary Refill: Capillary refill takes less than 2 seconds.  Neurological:     Mental Status: He is alert and oriented to person, place, and time.  Psychiatric:        Behavior: Behavior normal.      Musculoskeletal Exam: C-spine, thoracic spine, lumbar spine have good range of motion.  No midline spinal tenderness.  No SI joint tenderness.  Discomfort with ROM of the left shoulder.  Right shoulder has full ROM with no discomfort. elbow joints, wrist joints, MCPs, PIPs, DIPs have good range of motion with no synovitis.  Complete fist formation bilaterally.  Hip joints have good range of motion with no groin pain.  Knee joints have good range of motion no warmth or effusion.  Ankle joints have good range of motion no tenderness or joint swelling.  No evidence of Achilles tendinitis or plantar fasciitis. Mild prominence of the right 1st MTP joint but no synovitis noted.  No dactylitis.    CDAI Exam: CDAI Score: -- Patient Global: --; Provider Global: -- Swollen: --; Tender: -- Joint Exam 05/18/2024   No joint exam has been documented for this visit   There is currently no information documented on the homunculus. Go to the Rheumatology activity and complete the homunculus joint exam.  Investigation: No additional findings.  Imaging: No results found.  Recent Labs: Lab Results  Component Value Date   WBC 5.5 03/31/2024   HGB 17.3 (H) 03/31/2024   PLT 176 03/31/2024   NA 141 03/31/2024   K 4.8 03/31/2024   CL 104 03/31/2024   CO2 30 03/31/2024   GLUCOSE 79 03/31/2024   BUN 11 03/31/2024   CREATININE 0.94 03/31/2024   BILITOT 1.2 03/31/2024   ALKPHOS 36 (L) 03/12/2024   AST 41 (H) 03/31/2024   ALT 71 (H) 03/31/2024   PROT 7.4  03/31/2024   ALBUMIN 5.2 03/12/2024   CALCIUM  10.3 03/31/2024    Speciality Comments: No specialty comments available.  Procedures:  No procedures performed Allergies: Patient has no known allergies.   Assessment / Plan:     Visit Diagnoses: Idiopathic chronic gout of multiple sites without tophus - Symptom onset mid-20s-formally diagnosed about 1 year ago, x-rays of both feet unremarkable:  Uric acid was 5.6 on 03/31/2024.  He has been taking allopurinol  200 mg daily and has been tolerating allopurinol  without any side effects.  He has had occasional twinges of pain involving the right great toe for which she is taking colchicine  on an as needed basis.  His symptoms have been responsive to  colchicine .  He has not had any major gout flares since the last office visit.  He has no sign of any tophi.  Plan to recheck uric acid level today.  Plan to increase the dose of allopurinol  to 300 mg daily to try to reduce the frequency of flares.  He has been following dietary recommendations as recommended. He will notify us  if he continues to have recurrent gout flares.  Follow-up in the office in 6 months or sooner if needed.- Plan: Uric acid, Comprehensive metabolic panel with GFR, CBC with Differential/Platelet  Medication monitoring encounter -Allopurinol  200 mg daily--plan to increase to 300 mg daily pending lab results.  Plan to check CBC, CMP, and uric acid level today. - Plan: Uric acid, Comprehensive metabolic panel with GFR, CBC with Differential/Platelet  Transaminitis -AST 41 and ALT 71 on 03/31/2024.  Patient reports avoiding use of alcohol recently. Plan to check CMP today.  Plan: Comprehensive metabolic panel with GFR  Pain in both feet: X-rays of both feet were unremarkable on 03/31/2024.  No erosive changes were noted.  Reviewed x-ray images with the patient today in the office. No synovitis noted on examination.  Other medical conditions are listed as follows:  Dysfunction of both  eustachian tubes  Former smoker  Family history of heart attack  Hypertriglyceridemia  Orders: Orders Placed This Encounter  Procedures   Uric acid   Comprehensive metabolic panel with GFR   CBC with Differential/Platelet   No orders of the defined types were placed in this encounter.    Follow-Up Instructions: Return in about 6 months (around 11/15/2024) for Gout.   Waddell CHRISTELLA Craze, PA-C  Note - This record has been created using Dragon software.  Chart creation errors have been sought, but may not always  have been located. Such creation errors do not reflect on  the standard of medical care.      [1]  Social History Tobacco Use   Smoking status: Former    Types: Cigarettes, Cigars   Smokeless tobacco: Never  Vaping Use   Vaping status: Never Used  Substance Use Topics   Alcohol use: Yes    Alcohol/week: 6.0 - 9.0 standard drinks of alcohol    Types: 1 - 3 Glasses of wine, 5 - 6 Cans of beer per week   Drug use: Never   "

## 2024-05-18 ENCOUNTER — Encounter: Payer: Self-pay | Admitting: Physician Assistant

## 2024-05-18 ENCOUNTER — Ambulatory Visit: Attending: Physician Assistant | Admitting: Physician Assistant

## 2024-05-18 VITALS — BP 131/81 | HR 67 | Temp 98.0°F | Resp 14 | Ht 70.0 in | Wt 212.0 lb

## 2024-05-18 DIAGNOSIS — Z5181 Encounter for therapeutic drug level monitoring: Secondary | ICD-10-CM

## 2024-05-18 DIAGNOSIS — H6993 Unspecified Eustachian tube disorder, bilateral: Secondary | ICD-10-CM

## 2024-05-18 DIAGNOSIS — M109 Gout, unspecified: Secondary | ICD-10-CM

## 2024-05-18 DIAGNOSIS — E781 Pure hyperglyceridemia: Secondary | ICD-10-CM

## 2024-05-18 DIAGNOSIS — M79672 Pain in left foot: Secondary | ICD-10-CM

## 2024-05-18 DIAGNOSIS — M1A09X0 Idiopathic chronic gout, multiple sites, without tophus (tophi): Secondary | ICD-10-CM | POA: Diagnosis not present

## 2024-05-18 DIAGNOSIS — Z87891 Personal history of nicotine dependence: Secondary | ICD-10-CM

## 2024-05-18 DIAGNOSIS — R7401 Elevation of levels of liver transaminase levels: Secondary | ICD-10-CM | POA: Diagnosis not present

## 2024-05-18 DIAGNOSIS — M79671 Pain in right foot: Secondary | ICD-10-CM

## 2024-05-18 DIAGNOSIS — Z8249 Family history of ischemic heart disease and other diseases of the circulatory system: Secondary | ICD-10-CM

## 2024-05-19 ENCOUNTER — Ambulatory Visit: Payer: Self-pay | Admitting: Physician Assistant

## 2024-05-19 LAB — COMPREHENSIVE METABOLIC PANEL WITH GFR
AG Ratio: 2.5 (calc) (ref 1.0–2.5)
ALT: 94 U/L — ABNORMAL HIGH (ref 9–46)
AST: 47 U/L — ABNORMAL HIGH (ref 10–40)
Albumin: 5.3 g/dL — ABNORMAL HIGH (ref 3.6–5.1)
Alkaline phosphatase (APISO): 41 U/L (ref 36–130)
BUN: 14 mg/dL (ref 7–25)
CO2: 28 mmol/L (ref 20–32)
Calcium: 10 mg/dL (ref 8.6–10.3)
Chloride: 103 mmol/L (ref 98–110)
Creat: 1.07 mg/dL (ref 0.60–1.26)
Globulin: 2.1 g/dL (ref 1.9–3.7)
Glucose, Bld: 83 mg/dL (ref 65–99)
Potassium: 4.4 mmol/L (ref 3.5–5.3)
Sodium: 139 mmol/L (ref 135–146)
Total Bilirubin: 1.3 mg/dL — ABNORMAL HIGH (ref 0.2–1.2)
Total Protein: 7.4 g/dL (ref 6.1–8.1)
eGFR: 95 mL/min/1.73m2

## 2024-05-19 LAB — CBC WITH DIFFERENTIAL/PLATELET
Absolute Lymphocytes: 2316 {cells}/uL (ref 850–3900)
Absolute Monocytes: 534 {cells}/uL (ref 200–950)
Basophils Absolute: 48 {cells}/uL (ref 0–200)
Basophils Relative: 0.8 %
Eosinophils Absolute: 120 {cells}/uL (ref 15–500)
Eosinophils Relative: 2 %
HCT: 49.9 % (ref 39.4–51.1)
Hemoglobin: 17.3 g/dL — ABNORMAL HIGH (ref 13.2–17.1)
MCH: 31.3 pg (ref 27.0–33.0)
MCHC: 34.7 g/dL (ref 31.6–35.4)
MCV: 90.4 fL (ref 81.4–101.7)
MPV: 9.9 fL (ref 7.5–12.5)
Monocytes Relative: 8.9 %
Neutro Abs: 2982 {cells}/uL (ref 1500–7800)
Neutrophils Relative %: 49.7 %
Platelets: 179 Thousand/uL (ref 140–400)
RBC: 5.52 Million/uL (ref 4.20–5.80)
RDW: 13 % (ref 11.0–15.0)
Total Lymphocyte: 38.6 %
WBC: 6 Thousand/uL (ref 3.8–10.8)

## 2024-05-19 LAB — URIC ACID: Uric Acid, Serum: 5.7 mg/dL (ref 4.0–8.0)

## 2024-05-19 NOTE — Progress Notes (Signed)
 Uric acid 5.7--recommend increasing allopurinol  to 300 mg daily--please clarify if he would like to switch to the 300 mg tablet in order to just take one tablet daily?   Recommend rechecking lab work in 2-3 weeks.   Cbc stable.  LFTs remain elevated and continue to trend up--any alcohol use? Tylenol use? Any other medication changes? If not--recommend evaluation by GI.

## 2024-05-19 NOTE — Telephone Encounter (Signed)
 Please review and sign   LMOM for patient to call the office. When he calls back , patient needs to be advised to hold supplement and plan to recheck in 2-3 weeks.

## 2024-05-19 NOTE — Progress Notes (Signed)
 Ok to pend new prescription for allopurinol  300 mg daily.   Ok to hold supplement and plan to recheck in 2-3 weeks.

## 2024-05-20 MED ORDER — ALLOPURINOL 300 MG PO TABS: 300.0000 mg | ORAL_TABLET | Freq: Every day | ORAL | 0 refills | Status: AC

## 2024-06-03 ENCOUNTER — Other Ambulatory Visit: Payer: Self-pay

## 2024-06-03 DIAGNOSIS — M1A09X0 Idiopathic chronic gout, multiple sites, without tophus (tophi): Secondary | ICD-10-CM

## 2024-06-03 DIAGNOSIS — Z5181 Encounter for therapeutic drug level monitoring: Secondary | ICD-10-CM

## 2024-06-04 ENCOUNTER — Ambulatory Visit: Payer: Self-pay | Admitting: Physician Assistant

## 2024-06-04 LAB — CBC WITH DIFFERENTIAL/PLATELET
Absolute Lymphocytes: 2176 {cells}/uL (ref 850–3900)
Absolute Monocytes: 544 {cells}/uL (ref 200–950)
Basophils Absolute: 29 {cells}/uL (ref 0–200)
Basophils Relative: 0.6 %
Eosinophils Absolute: 152 {cells}/uL (ref 15–500)
Eosinophils Relative: 3.1 %
HCT: 48.6 % (ref 39.4–51.1)
Hemoglobin: 16.8 g/dL (ref 13.2–17.1)
MCH: 30.9 pg (ref 27.0–33.0)
MCHC: 34.6 g/dL (ref 31.6–35.4)
MCV: 89.5 fL (ref 81.4–101.7)
MPV: 10.3 fL (ref 7.5–12.5)
Monocytes Relative: 11.1 %
Neutro Abs: 1999 {cells}/uL (ref 1500–7800)
Neutrophils Relative %: 40.8 %
Platelets: 163 Thousand/uL (ref 140–400)
RBC: 5.43 Million/uL (ref 4.20–5.80)
RDW: 13.1 % (ref 11.0–15.0)
Total Lymphocyte: 44.4 %
WBC: 4.9 Thousand/uL (ref 3.8–10.8)

## 2024-06-04 LAB — COMPREHENSIVE METABOLIC PANEL WITH GFR
AG Ratio: 2.8 (calc) — ABNORMAL HIGH (ref 1.0–2.5)
ALT: 61 U/L — ABNORMAL HIGH (ref 9–46)
AST: 38 U/L (ref 10–40)
Albumin: 5.3 g/dL — ABNORMAL HIGH (ref 3.6–5.1)
Alkaline phosphatase (APISO): 40 U/L (ref 36–130)
BUN: 11 mg/dL (ref 7–25)
CO2: 25 mmol/L (ref 20–32)
Calcium: 10.2 mg/dL (ref 8.6–10.3)
Chloride: 106 mmol/L (ref 98–110)
Creat: 1.02 mg/dL (ref 0.60–1.26)
Globulin: 1.9 g/dL (ref 1.9–3.7)
Glucose, Bld: 100 mg/dL — ABNORMAL HIGH (ref 65–99)
Potassium: 4.4 mmol/L (ref 3.5–5.3)
Sodium: 141 mmol/L (ref 135–146)
Total Bilirubin: 1.4 mg/dL — ABNORMAL HIGH (ref 0.2–1.2)
Total Protein: 7.2 g/dL (ref 6.1–8.1)
eGFR: 100 mL/min/1.73m2

## 2024-06-04 LAB — URIC ACID: Uric Acid, Serum: 5.1 mg/dL (ref 4.0–8.0)

## 2024-06-04 NOTE — Progress Notes (Signed)
 Albumin remains elevated but stable.  Total bilirubin remains elevated and has gradually started to trend up.   ALT remains elevated but has improved-61 and AST has returned to WNL.   Uric acid has improved to 5.1.   CBC WNL

## 2024-11-16 ENCOUNTER — Ambulatory Visit: Admitting: Rheumatology

## 2025-03-14 ENCOUNTER — Encounter: Admitting: Internal Medicine
